# Patient Record
Sex: Female | Born: 1955 | Race: Black or African American | Hispanic: No | Marital: Married | Smoking: Former smoker
Health system: Southern US, Community
[De-identification: ages and names within clinical notes are randomized; demographics above are authoritative.]

## PROBLEM LIST (undated history)

## (undated) DIAGNOSIS — Z9889 Other specified postprocedural states: Secondary | ICD-10-CM

## (undated) DIAGNOSIS — R928 Other abnormal and inconclusive findings on diagnostic imaging of breast: Secondary | ICD-10-CM

## (undated) DIAGNOSIS — I10 Essential (primary) hypertension: Secondary | ICD-10-CM

## (undated) DIAGNOSIS — M1990 Unspecified osteoarthritis, unspecified site: Secondary | ICD-10-CM

## (undated) DIAGNOSIS — I2699 Other pulmonary embolism without acute cor pulmonale: Secondary | ICD-10-CM

## (undated) DIAGNOSIS — I890 Lymphedema, not elsewhere classified: Secondary | ICD-10-CM

## (undated) DIAGNOSIS — R112 Nausea with vomiting, unspecified: Secondary | ICD-10-CM

## (undated) DIAGNOSIS — E119 Type 2 diabetes mellitus without complications: Secondary | ICD-10-CM

## (undated) DIAGNOSIS — N816 Rectocele: Secondary | ICD-10-CM

## (undated) DIAGNOSIS — I82409 Acute embolism and thrombosis of unspecified deep veins of unspecified lower extremity: Secondary | ICD-10-CM

## (undated) DIAGNOSIS — T8859XA Other complications of anesthesia, initial encounter: Secondary | ICD-10-CM

## (undated) DIAGNOSIS — K219 Gastro-esophageal reflux disease without esophagitis: Secondary | ICD-10-CM

## (undated) DIAGNOSIS — K635 Polyp of colon: Secondary | ICD-10-CM

## (undated) HISTORY — PX: KNEE SURGERY: SHX244

## (undated) HISTORY — PX: MYOMECTOMY: SHX85

## (undated) HISTORY — DX: Essential (primary) hypertension: I10

## (undated) HISTORY — DX: Acute embolism and thrombosis of unspecified deep veins of unspecified lower extremity: I82.409

## (undated) HISTORY — PX: ABDOMINAL HYSTERECTOMY: SHX81

## (undated) HISTORY — DX: Other abnormal and inconclusive findings on diagnostic imaging of breast: R92.8

## (undated) HISTORY — DX: Other pulmonary embolism without acute cor pulmonale: I26.99

## (undated) HISTORY — PX: SHOULDER SURGERY: SHX246

## (undated) HISTORY — PX: LAPAROSCOPIC GASTRIC BANDING: SHX1100

## (undated) HISTORY — DX: Polyp of colon: K63.5

---

## 2021-03-06 ENCOUNTER — Other Ambulatory Visit: Payer: Self-pay

## 2021-03-06 ENCOUNTER — Emergency Department
Admission: EM | Admit: 2021-03-06 | Discharge: 2021-03-06 | Disposition: A | Discharge status: Home / Self Care | Payer: No Typology Code available for payment source | Source: Home / Self Care

## 2021-03-06 DIAGNOSIS — L03116 Cellulitis of left lower limb: Secondary | ICD-10-CM | POA: Diagnosis not present

## 2021-03-06 DIAGNOSIS — S80261A Insect bite (nonvenomous), right knee, initial encounter: Secondary | ICD-10-CM

## 2021-03-06 DIAGNOSIS — W57XXXA Bitten or stung by nonvenomous insect and other nonvenomous arthropods, initial encounter: Secondary | ICD-10-CM

## 2021-03-06 MED ORDER — DOXYCYCLINE HYCLATE 100 MG PO CAPS: 100.0000 mg | ORAL_CAPSULE | Freq: Two times a day (BID) | ORAL | 0 refills | Status: DC

## 2021-03-06 NOTE — Discharge Instructions (Signed)
I have sent in doxycycline for you to take one tablet twice a day for 10 days  Follow up with this office or with primary care if symptoms are persisting.  Follow up in the ER for high fever, trouble swallowing, trouble breathing, other concerning symptoms.  

## 2021-03-06 NOTE — ED Triage Notes (Signed)
Patient presents to Urgent Care with complaints of tick bite to right leg, posterior knee since 5 days ago. Patient reports the are is still itchy, would like it to be assessed, denies drainage or redness.

## 2021-03-06 NOTE — ED Provider Notes (Signed)
[facility]    CSN: [id number] Arrival date & time: 03/06/21  1504      History   Chief Complaint Chief Complaint  Patient presents with  . Insect Bite    Right Leg    HPI Natalie Carney is a 65 y.o. female.   Reports tick bite to R posterior knee x 5 days ago. States that she removed the tick. Reports that the area is red, warm, tender and itching. Has not attempted OTC treatment. There are no aggravating or alleviating factors. Denies headache, nausea, vomiting, diarrhea, rash, fatigue, drainage from the area, fever, other symptoms.  ROS per HPI  The history is provided by the patient.    History reviewed. No pertinent past medical history.  There are no problems to display for this patient.   History reviewed. No pertinent surgical history.  OB History   No obstetric history on file.      Home Medications    Prior to Admission medications   Medication Sig Start Date End Date Taking? Authorizing Provider  apixaban (ELIQUIS) 5 MG TABS tablet Take 5 mg by mouth 2 (two) times daily.   Yes [provider]  doxycycline (VIBRAMYCIN) 100 MG capsule Take 1 capsule (100 mg total) by mouth 2 (two) times daily. 03/06/21  Yes Moshe Cipro, NP  irbesartan (AVAPRO) 300 MG tablet Take 300 mg by mouth daily.   Yes [provider]  linaGLIPtin-metFORMIN HCl (JENTADUETO) 2.5-500 MG TABS Take by mouth.   Yes [provider]  Mirabegron (MYRBETRIQ PO) Take 25 mg by mouth.   Yes [provider]    Family History Family History  Problem Relation Age of Onset  . Healthy Mother   . Diabetes Father   . Hypertension Father     Social History Social History   Tobacco Use  . Smoking status: Former Smoker    Types: Cigarettes    Quit date: 12/25/1987    Years since quitting: 33.2  . Smokeless tobacco: Never Used  Substance Use Topics  . Alcohol use: Not Currently     Allergies   Patient has no known  allergies.   Review of Systems Review of Systems   Physical Exam Triage Vital Signs ED Triage Vitals  Enc Vitals Group     BP 03/06/21 1524 114/77     Pulse Rate 03/06/21 1524 94     Resp 03/06/21 1524 16     Temp 03/06/21 1524 98.2 F (36.8 C)     Temp Source 03/06/21 1524 Oral     SpO2 03/06/21 1524 97 %     Weight --      Height --      Head Circumference --      Peak Flow --      Pain Score 03/06/21 1515 0     Pain Loc --      Pain Edu? --      Excl. in GC? --    No data found.  Updated Vital Signs BP 114/77 (BP Location: Right Arm)   Pulse 94   Temp 98.2 F (36.8 C) (Oral)   Resp 16   SpO2 97%      Physical Exam Vitals and nursing note reviewed.  Constitutional:      General: She is not in acute distress.    Appearance: She is well-developed. She is obese.  HENT:     Head: Normocephalic and atraumatic.  Eyes:     Conjunctiva/sclera: Conjunctivae normal.  Cardiovascular:  Rate and Rhythm: Normal rate and regular rhythm.     Heart sounds: No murmur heard.   Pulmonary:     Effort: Pulmonary effort is normal. No respiratory distress.     Breath sounds: Normal breath sounds.  Abdominal:     Palpations: Abdomen is soft.     Tenderness: There is no abdominal tenderness.  Musculoskeletal:     Cervical back: Normal range of motion and neck supple.  Skin:    General: Skin is warm and dry.     Findings: Lesion present.       Neurological:     General: No focal deficit present.     Mental Status: She is alert and oriented to person, place, and time.  Psychiatric:        Mood and Affect: Mood normal.        Behavior: Behavior normal.        Thought Content: Thought content normal.      UC Treatments / Results  Labs (all labs ordered are listed, but only abnormal results are displayed) Labs Reviewed - No data to display  EKG   Radiology No results found.  Procedures Procedures (including critical care time)  Medications Ordered in  UC Medications - No data to display  Initial Impression / Assessment and Plan / UC Course  I have reviewed the triage vital signs and the nursing notes.  Pertinent labs & imaging results that were available during my care of the patient were reviewed by me and considered in my medical decision making (see chart for details).    Tick Bite Cellulitis  Prescribed doxycycline BID x 7 days Follow up with any fatigue, fever, rash, worsening symptoms Follow up with this office or with primary care if symptoms are persisting.  Follow up in the ER for high fever, trouble swallowing, trouble breathing, other concerning symptoms.   Final Clinical Impressions(s) / UC Diagnoses   Final diagnoses:  Insect bite of right knee, initial encounter  Cellulitis of left lower extremity     Discharge Instructions     I have sent in doxycycline for you to take one tablet twice a day for 10 days  Follow up with this office or with primary care if symptoms are persisting.  Follow up in the ER for high fever, trouble swallowing, trouble breathing, other concerning symptoms.     ED Prescriptions    Medication Sig Dispense Auth. Provider   doxycycline (VIBRAMYCIN) 100 MG capsule Take 1 capsule (100 mg total) by mouth 2 (two) times daily. 14 capsule Moshe Cipro, NP     PDMP not reviewed this encounter.   Moshe Cipro, NP 03/06/21 1641

## 2021-03-14 DIAGNOSIS — I87009 Postthrombotic syndrome without complications of unspecified extremity: Secondary | ICD-10-CM | POA: Insufficient documentation

## 2021-03-14 DIAGNOSIS — I872 Venous insufficiency (chronic) (peripheral): Secondary | ICD-10-CM | POA: Insufficient documentation

## 2021-04-11 ENCOUNTER — Encounter: Payer: Self-pay | Admitting: Medical-Surgical

## 2021-04-11 ENCOUNTER — Ambulatory Visit (INDEPENDENT_AMBULATORY_CARE_PROVIDER_SITE_OTHER): Payer: No Typology Code available for payment source | Admitting: Medical-Surgical

## 2021-04-11 ENCOUNTER — Other Ambulatory Visit: Payer: Self-pay

## 2021-04-11 VITALS — BP 113/75 | HR 63 | Temp 98.8°F | Ht 67.25 in | Wt 279.4 lb

## 2021-04-11 DIAGNOSIS — E118 Type 2 diabetes mellitus with unspecified complications: Secondary | ICD-10-CM | POA: Diagnosis not present

## 2021-04-11 DIAGNOSIS — I10 Essential (primary) hypertension: Secondary | ICD-10-CM | POA: Diagnosis not present

## 2021-04-11 DIAGNOSIS — Z7689 Persons encountering health services in other specified circumstances: Secondary | ICD-10-CM

## 2021-04-11 DIAGNOSIS — Z7901 Long term (current) use of anticoagulants: Secondary | ICD-10-CM

## 2021-04-11 DIAGNOSIS — R198 Other specified symptoms and signs involving the digestive system and abdomen: Secondary | ICD-10-CM

## 2021-04-11 DIAGNOSIS — I872 Venous insufficiency (chronic) (peripheral): Secondary | ICD-10-CM

## 2021-04-11 LAB — POCT GLYCOSYLATED HEMOGLOBIN (HGB A1C): Hemoglobin A1C: 6.4 % — AB (ref 4.0–5.6)

## 2021-04-11 MED ORDER — MIRABEGRON ER 25 MG PO TB24: 25.0000 mg | ORAL_TABLET | Freq: Every day | ORAL | 3 refills | Status: DC

## 2021-04-11 MED ORDER — IRBESARTAN 300 MG PO TABS: 300.0000 mg | ORAL_TABLET | Freq: Every day | ORAL | 3 refills | Status: DC

## 2021-04-11 NOTE — Progress Notes (Signed)
New Patient Office Visit  Subjective:  Patient ID: Natalie Carney, female    DOB: 1956/05/20  Age: 65 y.o. MRN: [id number]  CC:  Chief Complaint  Patient presents with  . Establish Care    HPI Natalie Carney presents to establish care.  She recently relocated from [place] and is a retiree from the post office.  Hypertension-taking irbesartan 300 mg daily, tolerating well without side effects.  Blood pressure looks great at 113/75.  Diabetes-has been diabetic for at least the last 5 years.  She takes linagliptin-metformin 2.5-500 mg tablets twice daily.  She does have some GI symptoms and wonders if this may be related to the medications.  Checks her sugar approximately twice weekly with her normal range between 99 and 111.  Lately she has been running a little higher with her relocation but still only at the 120-130 range.  Blood clots-history of DVT and PE.  On chronic anticoagulation with Eliquis 5 mg twice daily, tolerating well.  Notes that she does bruise and bleed easily but she is very careful on the blood thinners to avoid injury.  Venous insufficiency-Long history of venous insufficiency.  Has difficulty with her leg swelling regularly.  She does wear compression stockings bilaterally but there are days when she chooses not to wear them.  Keeps her feet elevated at night and eats a low-sodium diet.  GI symptoms-has intermittent nausea but notes that eating certain foods cause her stomach to be upset.  Has increased flatulence, belching, bloating, and loose stools when eating certain foods.  Unsure if this is a result of the metformin in her diabetes medication or if there is something going on with her GI tract.  She had an upper GI last year with no abnormal findings but did not go back for her follow-up as instructed due to relocation.  Plans to return to [place] for a visit next week and is looking to get an in office appointment with her GI provider there.  If they are unable to work  her in, she will let me know and we will do a referral for GI in our area.  Past Medical History:  Diagnosis Date  . Abnormal mammogram   . Colon polyp   . DVT (deep venous thrombosis) (HCC)   . Hypertension   . Pulmonary embolism [facility])     Past Surgical History:  Procedure Laterality Date  . ABDOMINAL HYSTERECTOMY    . KNEE SURGERY Right   . LAPAROSCOPIC GASTRIC BANDING    . SHOULDER SURGERY Left     Family History  Problem Relation Age of Onset  . Healthy Mother   . Diabetes Father   . Hypertension Father   . Heart attack Father   . Breast cancer Other   . Ovarian cancer Other     Social History   Socioeconomic History  . Marital status: Married    Spouse name: Not on file  . Number of children: Not on file  . Years of education: Not on file  . Highest education level: Not on file  Occupational History  . Not on file  Tobacco Use  . Smoking status: Former Smoker    Types: Cigarettes    Quit date: 12/25/1987    Years since quitting: 33.3  . Smokeless tobacco: Never Used  Vaping Use  . Vaping Use: Never used  Substance and Sexual Activity  . Alcohol use: Never  . Drug use: Never  . Sexual activity: Yes  Partners: Male    Birth control/protection: Surgical  Other Topics Concern  . Not on file  Social History Narrative  . Not on file   Social Determinants of Health   Financial Resource Strain: Not on file  Food Insecurity: Not on file  Transportation Needs: Not on file  Physical Activity: Not on file  Stress: Not on file  Social Connections: Not on file  Intimate Partner Violence: Not on file    ROS Review of Systems  Constitutional: Negative for chills, fatigue, fever and unexpected weight change.  Respiratory: Positive for cough (Nonproductive). Negative for chest tightness, shortness of breath and wheezing.   Cardiovascular: Positive for leg swelling. Negative for chest pain and palpitations.  Gastrointestinal: Positive for abdominal pain,  diarrhea and nausea. Negative for constipation and vomiting.  Genitourinary: Negative for dysuria, frequency and urgency.  Musculoskeletal: Positive for arthralgias, back pain, myalgias and neck pain.  Neurological: Negative for dizziness, light-headedness and headaches.  Hematological: Bruises/bleeds easily.  Psychiatric/Behavioral: Negative for dysphoric mood, self-injury, sleep disturbance and suicidal ideas. The patient is not nervous/anxious.     Objective:   Today's Vitals: BP 113/75   Pulse 63   Temp 98.8 F (37.1 C)   Ht 5' 7.25" (1.708 m)   Wt 279 lb 6.4 oz (126.7 kg)   SpO2 100%   BMI 43.44 kg/m   Physical Exam Vitals reviewed.  Constitutional:      General: She is not in acute distress.    Appearance: Normal appearance.  HENT:     Head: Normocephalic and atraumatic.  Cardiovascular:     Rate and Rhythm: Normal rate and regular rhythm.     Pulses: Normal pulses.     Heart sounds: Normal heart sounds. No murmur heard. No friction rub. No gallop.   Pulmonary:     Effort: Pulmonary effort is normal. No respiratory distress.     Breath sounds: Normal breath sounds. No wheezing.  Musculoskeletal:     Right lower leg: Edema present.     Left lower leg: Edema present.  Skin:    General: Skin is warm and dry.  Neurological:     Mental Status: She is alert and oriented to person, place, and time.  Psychiatric:        Mood and Affect: Mood normal.        Behavior: Behavior normal.        Thought Content: Thought content normal.        Judgment: Judgment normal.    Assessment & Plan:   1. Encounter to establish care Reviewed available information and discussed healthcare concerns with patient.  2. Controlled type 2 diabetes mellitus with complication, without long-term current use of insulin (HCC) Point-of-care hemoglobin A1c 6.4% today showing good control.  She is very interested in Ozempic for diabetes control and weight loss purposes.  We will send this to  the pharmacy to see if insurance will cover it and plan to decrease her linagliptin-metformin to once daily. - POCT HgB A1C  3. Essential hypertension Well-controlled on irbesartan 300 mg daily.  Refill sent to pharmacy.  4. Gastrointestinal complaint GI complaint likely related to metformin but would benefit from follow-up with GI provider.  If she is not able to get in with her provider in [place] next week, she will let me know and I will put in a referral for her.  5. Venous (peripheral) insufficiency Continue compression stockings bilaterally.  Low-sodium diet and leg elevation in the evenings.  6.  Chronic anticoagulation Continue Eliquis 5 mg twice daily as prescribed.  Refills sent to pharmacy when needed.   Outpatient Encounter Medications as of 04/11/2021  Medication Sig  . apixaban (ELIQUIS) 5 MG TABS tablet Take 5 mg by mouth 2 (two) times daily.  . Cholecalciferol 25 MCG (1000 UT) tablet Take 1 tablet by mouth daily.  Marland Kitchen esomeprazole (NEXIUM) 40 MG capsule Take 40 mg by mouth daily at 12 noon.  Marland Kitchen linaGLIPtin-metFORMIN HCl (JENTADUETO) 2.5-500 MG TABS Take 1 tablet by mouth 2 (two) times daily.  . magnesium gluconate (MAGONATE) 500 MG tablet Take 500 mg by mouth daily.  . mirabegron ER (MYRBETRIQ) 25 MG TB24 tablet Take 1 tablet (25 mg total) by mouth daily.  . multivitamin-lutein (OCUVITE-LUTEIN) CAPS capsule Take 1 capsule by mouth daily.  . [DISCONTINUED] irbesartan (AVAPRO) 300 MG tablet Take 300 mg by mouth daily.  . [DISCONTINUED] Mirabegron (MYRBETRIQ PO) Take 25 mg by mouth daily.  . irbesartan (AVAPRO) 300 MG tablet Take 1 tablet (300 mg total) by mouth daily.  . [DISCONTINUED] doxycycline (VIBRAMYCIN) 100 MG capsule Take 1 capsule (100 mg total) by mouth 2 (two) times daily.   No facility-administered encounter medications on file as of 04/11/2021.    Follow-up: Return in about 6 months (around 10/11/2021) for DM/HTN/blood clots follow up.   Thayer Ohm, DNP,  APRN, FNP-BC [facility]

## 2021-04-11 NOTE — Patient Instructions (Signed)
Critical care medicine: Principles of diagnosis and management in the adult (4th ed., pp. 1251-1270). Saunders."> Miller's anesthesia (8th ed., pp. 232-250). Saunders.">  Advance Directive  Advance directives are legal documents that allow you to make decisions about your health care and medical treatment in case you become unable to communicate for yourself. Advance directives let your wishes be known to family, friends, and health care providers. Discussing and writing advance directives should happen over time rather than all at once. Advance directives can be changed and updated at any time. There are different types of advance directives, such as:  Medical power of attorney.  Living will.  Do not resuscitate (DNR) order or do not attempt resuscitation (DNAR) order. Health care proxy and medical power of attorney A health care proxy is also called a health care agent. This person is appointed to make medical decisions for you when you are unable to make decisions for yourself. Generally, people ask a trusted friend or family member to act as their proxy and represent their preferences. Make sure you have an agreement with your trusted person to act as your proxy. A proxy may have to make a medical decision on your behalf if your wishes are not known. A medical power of attorney, also called a durable power of attorney for health care, is a legal document that names your health care proxy. Depending on the laws in your state, the document may need to be:  Signed.  Notarized.  Dated.  Copied.  Witnessed.  Incorporated into your medical record. You may also want to appoint a trusted person to manage your money in the event you are unable to do so. This is called a durable power of attorney for finances. It is a separate legal document from the durable power of attorney for health care. You may choose your health care proxy or someone different to act as your agent in money matters. If you  do not appoint a proxy, or there is a concern that the proxy is not acting in your best interest, a court may appoint a guardian to act on your behalf. Living will A living will is a set of instructions that state your wishes about medical care when you cannot express them yourself. Health care providers should keep a copy of your living will in your medical record. You may want to give a copy to family members or friends. To alert caregivers in case of an emergency, you can place a card in your wallet to let them know that you have a living will and where they can find it. A living will is used if you become:  Terminally ill.  Disabled.  Unable to communicate or make decisions. The following decisions should be included in your living will:  To use or not to use life support equipment, such as dialysis machines and breathing machines (ventilators).  Whether you want a DNR or DNAR order. This tells health care providers not to use cardiopulmonary resuscitation (CPR) if breathing or heartbeat stops.  To use or not to use tube feeding.  To be given or not to be given food and fluids.  Whether you want comfort (palliative) care when the goal becomes comfort rather than a cure.  Whether you want to donate your organs and tissues. A living will does not give instructions for distributing your money and property if you should pass away. DNR or DNAR A DNR or DNAR order is a request not to have CPR in   the event that your heart stops beating or you stop breathing. If a DNR or DNAR order has not been made and shared, a health care provider will try to help any patient whose heart has stopped or who has stopped breathing. If you plan to have surgery, talk with your health care provider about how your DNR or DNAR order will be followed if problems occur. What if I do not have an advance directive? Some states assign family decision makers to act on your behalf if you do not have an advance directive.  Each state has its own laws about advance directives. You may want to check with your health care provider, attorney, or state representative about the laws in your state. Summary  Advance directives are legal documents that allow you to make decisions about your health care and medical treatment in case you become unable to communicate for yourself.  The process of discussing and writing advance directives should happen over time. You can change and update advance directives at any time.  Advance directives may include a medical power of attorney, a living will, and a DNR or DNAR order. This information is not intended to replace advice given to you by your health care provider. Make sure you discuss any questions you have with your health care provider. Document Revised: 09/13/2020 Document Reviewed: 09/13/2020 Elsevier Patient Education  2021 Elsevier Inc.  

## 2021-04-13 ENCOUNTER — Other Ambulatory Visit: Payer: Self-pay | Admitting: Medical-Surgical

## 2021-04-13 MED ORDER — OZEMPIC (0.25 OR 0.5 MG/DOSE) 2 MG/1.5ML ~~LOC~~ SOPN
PEN_INJECTOR | SUBCUTANEOUS | 1 refills | Status: DC
Start: 2021-04-13 — End: 2021-06-13

## 2021-04-13 MED ORDER — ONDANSETRON 8 MG PO TBDP: 8.0000 mg | ORAL_TABLET | Freq: Three times a day (TID) | ORAL | 3 refills | Status: DC | PRN

## 2021-04-13 NOTE — Progress Notes (Signed)
Discussed switching up her diabetes medications in order to have some weight loss benefit. Sending in Ozempic 0.25mg  weekly to see if insurance will approve it. If they do, she should stop the linagliptin-metformin combination. The dose of Ozempic increases to 0.5mg  after 4 weeks and the up to 1mg  4 weeks later. Monitor glucose closely. The Ozempic may cause some nausea initially so sending in Zofran to use as needed to help with that.   Please contact her to make her aware and if she has any trouble with getting the medication, have her give a call.

## 2021-04-13 NOTE — Progress Notes (Signed)
Pt aware and verbalized understanding. Pt will call back if insurance will not cover Rx. No further questions or concerns at this time.

## 2021-05-08 ENCOUNTER — Telehealth: Payer: Self-pay

## 2021-05-08 NOTE — Telephone Encounter (Signed)
FYI: Pt called wanting clarification of how she is supposed to take Ozempic. I told her that the Rx is written to take 0.25 mg once a week for 28 days (4 shots), then increase to 0.5 mg once a week for 28 days (4 shots), then increase to 1 mg once a week. Pt states she did not start the injections when she first got the Rx, and that today was her 2nd injection. She said that she was confused because those instructions were not what the pharmacy put on her Rx box. She said that the pharmacy put "0.25 for 28 days, then 0.5 mg for 28 days, then 1 mg once a week." She said that she has only been doing the injection once a week, but just wanted to make sure she was taking the med correctly. I told her that she should only take 1 shot a week, that for the next 2 injections she should take 0.25, then increase to 0.5 mg once a week for the next 4 injections, then increase to 1 mg once a week from that point on. Pt verbalized understanding of instructions. No further questions or concerns at this time.

## 2021-06-13 ENCOUNTER — Other Ambulatory Visit: Payer: Self-pay

## 2021-06-13 DIAGNOSIS — E118 Type 2 diabetes mellitus with unspecified complications: Secondary | ICD-10-CM

## 2021-06-13 MED ORDER — SEMAGLUTIDE (1 MG/DOSE) 4 MG/3ML ~~LOC~~ SOPN
1.0000 mg | PEN_INJECTOR | SUBCUTANEOUS | 0 refills | Status: AC
Start: 2021-06-13 — End: 2021-07-11

## 2021-07-20 ENCOUNTER — Other Ambulatory Visit: Payer: Self-pay

## 2021-07-20 ENCOUNTER — Encounter: Payer: Self-pay | Admitting: Obstetrics and Gynecology

## 2021-07-20 ENCOUNTER — Ambulatory Visit (INDEPENDENT_AMBULATORY_CARE_PROVIDER_SITE_OTHER): Payer: No Typology Code available for payment source | Admitting: Obstetrics and Gynecology

## 2021-07-20 ENCOUNTER — Encounter: Payer: Self-pay | Admitting: *Deleted

## 2021-07-20 VITALS — BP 168/66 | HR 65 | Resp 16 | Ht 67.25 in

## 2021-07-20 DIAGNOSIS — N898 Other specified noninflammatory disorders of vagina: Secondary | ICD-10-CM

## 2021-07-20 DIAGNOSIS — S39011S Strain of muscle, fascia and tendon of abdomen, sequela: Secondary | ICD-10-CM

## 2021-07-20 DIAGNOSIS — Z1231 Encounter for screening mammogram for malignant neoplasm of breast: Secondary | ICD-10-CM | POA: Diagnosis not present

## 2021-07-20 DIAGNOSIS — R3915 Urgency of urination: Secondary | ICD-10-CM

## 2021-07-20 DIAGNOSIS — Z01419 Encounter for gynecological examination (general) (routine) without abnormal findings: Secondary | ICD-10-CM | POA: Diagnosis not present

## 2021-07-20 DIAGNOSIS — N819 Female genital prolapse, unspecified: Secondary | ICD-10-CM | POA: Diagnosis not present

## 2021-07-20 NOTE — Progress Notes (Signed)
Last pap 04/25/20- negative  Last mam- 04/21/20 Breast U/S- 07/28/20- normal

## 2021-07-20 NOTE — Progress Notes (Signed)
GYNECOLOGY ANNUAL PREVENTATIVE CARE ENCOUNTER NOTE  Subjective:   Aundra Pung is a 65 y.o. G1P1 female here for a annual gynecologic exam. Current complaints: none, here for annual. Does report some prolapse and having to push organs back inside her vagina occasionally, not sexually active as she has significant pain in abdominal wall.     Denies abnormal vaginal bleeding, discharge, pelvic pain, problems with intercourse or other gynecologic concerns. Declines STI screen.   Gynecologic History No LMP recorded. Patient has had a hysterectomy. Contraception: status post hysterectomy Last Pap: 03/2020. Results: normal Last mammogram: 2021. Results: normal DEXA: UTD  Obstetric History OB History  Gravida Para Term Preterm AB Living  1 1          SAB IAB Ectopic Multiple Live Births          1    # Outcome Date GA Lbr Len/2nd Weight Sex Delivery Anes PTL Lv  1 Para      Vag-Spont       Past Medical History:  Diagnosis Date   Abnormal mammogram    Colon polyp    DVT (deep venous thrombosis) (HCC)    Hypertension    Pulmonary embolism (HCC)     Past Surgical History:  Procedure Laterality Date   ABDOMINAL HYSTERECTOMY     KNEE SURGERY Right    LAPAROSCOPIC GASTRIC BANDING     SHOULDER SURGERY Left     Current Outpatient Medications on File Prior to Visit  Medication Sig Dispense Refill   apixaban (ELIQUIS) 5 MG TABS tablet Take 5 mg by mouth 2 (two) times daily.     Cholecalciferol (D3 2000) 50 MCG (2000 UT) CAPS Take by mouth.     Cholecalciferol 25 MCG (1000 UT) tablet Take 1 tablet by mouth daily.     esomeprazole (NEXIUM) 40 MG capsule Take 40 mg by mouth daily at 12 noon.     magnesium gluconate (MAGONATE) 500 MG tablet Take 500 mg by mouth daily.     mirabegron ER (MYRBETRIQ) 25 MG TB24 tablet Take 1 tablet (25 mg total) by mouth daily. 90 tablet 3   multivitamin-lutein (OCUVITE-LUTEIN) CAPS capsule Take 1 capsule by mouth daily.     valsartan (DIOVAN) 320 MG  tablet Take 320 mg by mouth daily.     ondansetron (ZOFRAN-ODT) 8 MG disintegrating tablet Take 1 tablet (8 mg total) by mouth every 8 (eight) hours as needed for nausea. (Patient not taking: Reported on 07/20/2021) 20 tablet 3   No current facility-administered medications on file prior to visit.    No Known Allergies  Social History   Socioeconomic History   Marital status: Married    Spouse name: Not on file   Number of children: Not on file   Years of education: Not on file   Highest education level: Not on file  Occupational History   Not on file  Tobacco Use   Smoking status: Former    Types: Cigarettes    Quit date: 12/25/1987    Years since quitting: 33.5   Smokeless tobacco: Never  Vaping Use   Vaping Use: Never used  Substance and Sexual Activity   Alcohol use: Never   Drug use: Never   Sexual activity: Yes    Partners: Male    Birth control/protection: Surgical  Other Topics Concern   Not on file  Social History Narrative   Not on file   Social Determinants of Health   Financial Resource Strain: Not on  file  Food Insecurity: Not on file  Transportation Needs: Not on file  Physical Activity: Not on file  Stress: Not on file  Social Connections: Not on file  Intimate Partner Violence: Not on file    Family History  Problem Relation Age of Onset   Diabetes Father    Hypertension Father    Heart attack Father    Breast cancer Other    Ovarian cancer Other    Breast cancer Maternal Aunt    The following portions of the patient's history were reviewed and updated as appropriate: allergies, current medications, past family history, past medical history, past social history, past surgical history and problem list.  Review of Systems Pertinent items are noted in HPI.   Objective:  BP (!) 168/66   Pulse 65   Resp 16   Ht 5' 7.25" (1.708 m)   BMI 43.44 kg/m  CONSTITUTIONAL: Well-developed, well-nourished female in no acute distress.  HENT:   Normocephalic, atraumatic, External right and left ear normal. Oropharynx is clear and moist EYES: Conjunctivae and EOM are normal. Pupils are equal, round, and reactive to light. No scleral icterus.  NECK: Normal range of motion, supple, no masses.  Normal thyroid.  SKIN: Skin is warm and dry. No rash noted. Not diaphoretic. No erythema. No pallor. NEUROLOGIC: Alert and oriented to person, place, and time. Normal reflexes, muscle tone coordination. No cranial nerve deficit noted. PSYCHIATRIC: Normal mood and affect. Normal behavior. Normal judgment and thought content. CARDIOVASCULAR: Normal heart rate noted RESPIRATORY: Effort normal, no problems with respiration noted. BREASTS: Symmetric in size. No masses, skin changes, nipple drainage, or lymphadenopathy. ABDOMEN: Soft, no distention noted.  No tenderness, rebound or guarding. Well healed pfannenstiel incision PELVIC: Normal appearing external genitalia; normal appearing vaginal mucosa and cervix.  1 cm right sided vaginal wall lesion near introitus protruding from wall, appearance of cyst, No abnormal discharge noted.  Uterus surgically absent, no other palpable masses, no adnexal tenderness. MUSCULOSKELETAL: Normal range of motion. No tenderness.  No cyanosis, clubbing, or edema.  2+ distal pulses.  Exam done with chaperone present.  Assessment and Plan:   1. Well woman exam Healthy female exam  2. Encounter for screening mammogram for malignant neoplasm of breast Mammogram today  3. Vaginal lesion - Benign appearing as appearance of cyst just under serosa but offered biopsy, patient declines and elects to watch and wait, will reexamine 6 months - she verbalizes understanding of possibility of missing malignancy if no biopsy done now, prefers to see if grows or recedes  4. Strain of rectus abdominis muscle, sequela - Pt has had pain in abdominal wall for 20 years, since hysterectomy - recommend PT for assessment, core  strengthening exercises  5. Female genital prolapse, unspecified type - Patient has to push pelvic organs back inside sometimes, has never been addressed, briefly reviewed options as this bothersome to her - recommend referral to Urogyn  6. Urinary urgency - Reviewed strategies for improvement, timed voiding, limiting intake, decreasing bladder irritants - cont myrbtriq which she states helps   COVID vaccine TD Declines STI screen. Mammogram ordered today Referral for colonoscopy n/a Flu vaccine n/a DEXA not due   Routine preventative health maintenance measures emphasized. Please refer to After Visit Summary for other counseling recommendations.    Baldemar Lenis, MD, [facility] Attending Center for [facility])

## 2021-07-25 ENCOUNTER — Ambulatory Visit: Payer: No Typology Code available for payment source | Admitting: Physical Therapy

## 2021-07-25 ENCOUNTER — Encounter: Payer: Self-pay | Admitting: Physical Therapy

## 2021-07-25 DIAGNOSIS — G8929 Other chronic pain: Secondary | ICD-10-CM | POA: Diagnosis not present

## 2021-07-25 DIAGNOSIS — M6281 Muscle weakness (generalized): Secondary | ICD-10-CM

## 2021-07-25 DIAGNOSIS — R252 Cramp and spasm: Secondary | ICD-10-CM

## 2021-07-25 DIAGNOSIS — M545 Low back pain, unspecified: Secondary | ICD-10-CM

## 2021-07-25 NOTE — Therapy (Signed)
[facility] [place] [address] [place], [place], [address] Phone: [phone]   Fax:  [phone]  Physical Therapy Evaluation  Patient Details  Name: Natalie Carney MRN: [id number] Date of Birth: 04-28-1956 Referring Provider (PT): Leroy Libman MD   Encounter Date: 07/25/2021   PT End of Session - 07/25/21 0933     Visit Number 1    Number of Visits 12    Date for PT Re-Evaluation 09/05/21    PT Start Time 0933    PT Stop Time 1026    PT Time Calculation (min) 53 min    Activity Tolerance Patient tolerated treatment well    Behavior During Therapy [facility] for tasks assessed/performed             Past Medical History:  Diagnosis Date   Abnormal mammogram    Colon polyp    DVT (deep venous thrombosis) (HCC)    Hypertension    Pulmonary embolism (HCC)     Past Surgical History:  Procedure Laterality Date   ABDOMINAL HYSTERECTOMY     KNEE SURGERY Right    LAPAROSCOPIC GASTRIC BANDING     SHOULDER SURGERY Left     There were no vitals filed for this visit.    Subjective Assessment - 07/25/21 0937     Subjective If I bend down too far or bend over with crossed legs I feel a squeezing in the right lower abdomen. This started after her hysterctomy in 2005. She does some stretching and eventually it works itself out. Refraining from sex due to the activation of the muscle immediately coming on. Sometimes she gets it on the left side too. Patient reports she does have bil lower back pain. It does not bother her often.    Pertinent History DVTs bil (on blood thinners), hysterectomy 2005, had a lap band and then had it removed 5-6 yrs ago (after 3 yrs), two left shoulder surgeries and right knee surgery    Patient Stated Goals get rid of pain    Currently in Pain? Yes    Pain Score 10-Worst pain ever    Pain Location Abdomen    Pain Orientation Right    Pain Descriptors / Indicators Squeezing    Pain Type Chronic pain     Pain Onset More than a month ago    Pain Frequency Intermittent    Aggravating Factors  bending, sex    Pain Relieving Factors stretching    Effect of Pain on Daily Activities tying her shoes, using dryer                [facility] PT Assessment - 07/25/21 0001       Assessment   Medical Diagnosis rectus abdominus strain    Referring Provider (PT) Leroy Libman MD    Onset Date/Surgical Date 12/24/10    Hand Dominance Left    Next MD Visit 3 months      Precautions   Precautions None      Restrictions   Weight Bearing Restrictions No      Balance Screen   Has the patient fallen in the past 6 months No    Has the patient had a decrease in activity level because of a fear of falling?  No    Is the patient reluctant to leave their home because of a fear of falling?  No      Home Tourist information centre manager residence      Prior Function  Level of Independence Independent    Vocation Unemployed      Posture/Postural Control   Posture Comments forward head, increased lumbar lordosis      ROM / Strength   AROM / PROM / Strength AROM      AROM   Overall AROM Comments lumbar flex 75%, bil SB limited 75%, right rotation limited 25%, ext WNL feels pull in all planes; bil hips [facility]      Flexibility   Soft Tissue Assessment /Muscle Length yes    Hamstrings WNL    Quadriceps bil tightness    Piriformis WNL    Quadratus Lumborum tight; left > right      Palpation   Spinal mobility WNL but painful on left with muscular tightness    Palpation comment marked left iliopsoas, mod right; marked gluteals bil      Special Tests   Other special tests neg Thomas test bil                        Objective measurements completed on examination: See above findings.       [facility] Adult PT Treatment/Exercise - 07/25/21 0001       Self-Care   Self-Care Other Self-Care Comments    Other Self-Care Comments  MFR with ball to psoas and gluteals; diaphragmatic  breathing in hooklying x 5 breaths      Exercises   Exercises Lumbar      Lumbar Exercises: Stretches   Other Lumbar Stretch Exercise open book with top leg extended x 5 bil      Manual Therapy   Manual Therapy Myofascial release;Joint mobilization    Joint Mobilization UPA mobs bil lumbar gd I/II    Myofascial Release right hip flexor release in hooklying with active hip flexion x 10; attempted left but too painful today                    PT Education - 07/25/21 1100     Education Details HEP; self MFR of psoas in standing with ball    Person(s) Educated Patient    Methods Explanation;Demonstration;Handout    Comprehension Verbalized understanding;Returned demonstration                 PT Long Term Goals - 07/25/21 1048       PT LONG TERM GOAL #1   Title Ind with advanced HEP    Time 6    Period Weeks    Status New      PT LONG TERM GOAL #2   Title Patient to report decreased frequency and intensity of abdominal cramping by >= 50% with ADLS.    Time 6    Period Weeks    Status New      PT LONG TERM GOAL #3   Title Patient able to tie her shoes without abdominal cramping.    Time 6    Period Weeks    Status New      PT LONG TERM GOAL #4   Title Patient to demo improved lumbar flexion and lateral flexion to [facility] to decrease strain on her back and hips.    Time 6    Period Weeks    Status New      PT LONG TERM GOAL #5   Title Patient to demo 5/5 end range hip flexion to decrease strain on hip flexors. (eg from 90 deg and above)    Time 6  Period Weeks    Status New                    Plan - 07/25/21 1029     Clinical Impression Statement Patient presents with c/o of intemittent right abdominal cramping x 10 years. The cramping began a few years after her hysterectomy and is aggravated by tying her shoes and bending forward. Patient is unable to have sex due to pain as well. She reports intermittent low back pain as well. She is  significantly limited with lumbar flexion and lateral bending, however her PA mobility is [facility]. Left PA mobs grade II are very painful. She has marked tenderness on the Left side in her iliopsoas, gluteals and lumbar, more so than on the right. She was able to tolerate a psoas release on the right but not the left today. She has generally good flexibility in bil LE except for her bil quads. She had negative Thomas test bil. She has weakness in end range hip flexion and poor lumbar stabilization with MMT of hip flexors. She will benefit from PT to address these deficits.    Personal Factors and Comorbidities Comorbidity 3+    Comorbidities DVTs bil (on blood thinners), hysterectomy 2005, had a lap band and then had it removed 5-6 yrs ago (after 3 yrs), two left shoulder surgeries and right knee surgery    Examination-Activity Limitations Bend;Dressing;Other   sexual intercourse   Stability/Clinical Decision Making Evolving/Moderate complexity    Clinical Decision Making Low    Rehab Potential Excellent    PT Frequency 2x / week    PT Duration 6 weeks    PT Treatment/Interventions ADLs/Self Care Home Management;Aquatic Therapy;Cryotherapy;Electrical Stimulation;Moist Heat;Therapeutic exercise;Therapeutic activities;Patient/family education;Manual techniques;Spinal Manipulations;Joint Manipulations;Taping    PT Next Visit Plan Review HEP, add lateral flexion stretch, give psoas release video, spinal mobs, isometric to eccentric end range hip flexor strengthening as tolerated    PT Home Exercise Plan [email or web address]    Consulted and Agree with Plan of Care Patient             Patient will benefit from skilled therapeutic intervention in order to improve the following deficits and impairments:  Decreased range of motion, Increased muscle spasms, Pain, Impaired flexibility, Decreased strength  Visit Diagnosis: Cramp and spasm - Plan: PT plan of care cert/re-cert  Chronic bilateral low back pain without  sciatica - Plan: PT plan of care cert/re-cert  Muscle weakness (generalized) - Plan: PT plan of care cert/re-cert     Problem List Patient Active Problem List   Diagnosis Date Noted   Chronic anticoagulation 04/11/2021   Essential hypertension 04/11/2021   Controlled type 2 diabetes mellitus with complication, without long-term current use of insulin (HCC) 04/11/2021   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

## 2021-07-25 NOTE — Patient Instructions (Signed)
Access Code: [email or web address] URL: [email or web address] Date: 07/25/2021 Prepared by: Raynelle Fanning  Exercises Sidelying Open Book Thoracic Lumbar Rotation and Extension - 1 x daily - 7 x weekly - 3 sets - 10 reps Supine Diaphragmatic Breathing - 1-2 x daily - 7 x weekly - 2 sets - 10 reps Standing Quadratus Lumborum Mobilization with Small Ball on Wall - 1 x daily - 7 x weekly - 3 sets - 10 reps

## 2021-07-27 ENCOUNTER — Ambulatory Visit (INDEPENDENT_AMBULATORY_CARE_PROVIDER_SITE_OTHER): Payer: No Typology Code available for payment source | Admitting: Physical Therapy

## 2021-07-27 ENCOUNTER — Encounter: Payer: Self-pay | Admitting: Physical Therapy

## 2021-07-27 ENCOUNTER — Ambulatory Visit (INDEPENDENT_AMBULATORY_CARE_PROVIDER_SITE_OTHER): Payer: No Typology Code available for payment source

## 2021-07-27 ENCOUNTER — Other Ambulatory Visit: Payer: Self-pay

## 2021-07-27 DIAGNOSIS — M545 Low back pain, unspecified: Secondary | ICD-10-CM

## 2021-07-27 DIAGNOSIS — G8929 Other chronic pain: Secondary | ICD-10-CM

## 2021-07-27 DIAGNOSIS — R252 Cramp and spasm: Secondary | ICD-10-CM

## 2021-07-27 DIAGNOSIS — Z1231 Encounter for screening mammogram for malignant neoplasm of breast: Secondary | ICD-10-CM

## 2021-07-27 DIAGNOSIS — M6281 Muscle weakness (generalized): Secondary | ICD-10-CM | POA: Diagnosis not present

## 2021-07-27 NOTE — Patient Instructions (Signed)
Access Code: [email or web address] URL: [email or web address] Date: 07/27/2021 Prepared by: Raynelle Fanning  Exercises Sidelying Open Book Thoracic Lumbar Rotation and Extension - 1 x daily - 7 x weekly - 3 sets - 10 reps Supine Diaphragmatic Breathing - 1-2 x daily - 7 x weekly - 2 sets - 10 reps Standing Quadratus Lumborum Mobilization with Small Ball on Wall - 1 x daily - 7 x weekly - 3 sets - 10 reps Supine Bridge - 2 x daily - 7 x weekly - 1-3 sets - 10 reps Supine March - 1 x daily - 7 x weekly - 2 sets - 10 reps

## 2021-07-27 NOTE — Therapy (Signed)
[facility] [place] [address] [place], [place], [address] Phone: [phone]   Fax:  [phone]  Physical Therapy Treatment  Patient Details  Name: Natalie Carney MRN: [id number] Date of Birth: 04/15/1956 Referring Provider (PT): Leroy Libman MD   Encounter Date: 07/27/2021   PT End of Session - 07/27/21 1012     Visit Number 2    Number of Visits 12    Date for PT Re-Evaluation 09/05/21    PT Start Time 1012    PT Stop Time 1057    PT Time Calculation (min) 45 min    Activity Tolerance Patient tolerated treatment well    Behavior During Therapy [facility] for tasks assessed/performed             Past Medical History:  Diagnosis Date   Abnormal mammogram    Colon polyp    DVT (deep venous thrombosis) (HCC)    Hypertension    Pulmonary embolism (HCC)     Past Surgical History:  Procedure Laterality Date   ABDOMINAL HYSTERECTOMY     KNEE SURGERY Right    LAPAROSCOPIC GASTRIC BANDING     SHOULDER SURGERY Left     There were no vitals filed for this visit.   Subjective Assessment - 07/27/21 1012     Subjective Feeling pain more on left side today and into ant hip (TFL region indicated).    Pertinent History DVTs bil (on blood thinners), hysterectomy 2005, had a lap band and then had it removed 5-6 yrs ago (after 3 yrs), two left shoulder surgeries and right knee surgery    Patient Stated Goals get rid of pain    Currently in Pain? Yes    Pain Score 5     Pain Location Hip    Pain Orientation Left;Anterior    Pain Descriptors / Indicators Burning    Pain Type Chronic pain                               [facility] Adult PT Treatment/Exercise - 07/27/21 0001       Lumbar Exercises: Stretches   Hip Flexor Stretch Right;Left;5 reps   more for calming of CNS vs stretch   Hip Flexor Stretch Limitations 2 sets; standing on black step, one leg off and swinging leg with abdominals stabilizing       Lumbar Exercises: Supine   Ab Set 5 reps;5 seconds    AB Set Limitations using pelvic floor muscles originally then isolating to TA    Pelvic Tilt 1 rep    Pelvic Tilt Limitations no difficulty    Bent Knee Raise 5 reps    Bent Knee Raise Limitations ea    Bridge 20 reps    Bridge Limitations pt reports back feeling looser      Lumbar Exercises: Prone   Straight Leg Raise 5 reps    Straight Leg Raises Limitations each    Other Prone Lumbar Exercises pelvic press series 5x 5 sec, unilateral and bil knee flex x 5 ea      Lumbar Exercises: Quadruped   Straight Leg Raise 5 reps    Straight Leg Raises Limitations ea; hard on knees but good form      Manual Therapy   Manual Therapy Myofascial release;Joint mobilization    Manual therapy comments FABER/FABIR assessed bil; all negative    Joint Mobilization gentle UPA left side gd I/II still quite paiful at  L4/5, L5/S1; long leg tractin to left LE 2 x 20 sec (no relief of ant hip sx)    Myofascial Release left QL release in SDLY; J stroke to Left gluteals                    PT Education - 07/27/21 1218     Education Details HEP progressed    Person(s) Educated Patient    Methods Explanation;Demonstration;Handout    Comprehension Verbalized understanding;Returned demonstration                 PT Long Term Goals - 07/25/21 1048       PT LONG TERM GOAL #1   Title Ind with advanced HEP    Time 6    Period Weeks    Status New      PT LONG TERM GOAL #2   Title Patient to report decreased frequency and intensity of abdominal cramping by >= 50% with ADLS.    Time 6    Period Weeks    Status New      PT LONG TERM GOAL #3   Title Patient able to tie her shoes without abdominal cramping.    Time 6    Period Weeks    Status New      PT LONG TERM GOAL #4   Title Patient to demo improved lumbar flexion and lateral flexion to [facility] to decrease strain on her back and hips.    Time 6    Period Weeks    Status New       PT LONG TERM GOAL #5   Title Patient to demo 5/5 end range hip flexion to decrease strain on hip flexors. (eg from 90 deg and above)    Time 6    Period Weeks    Status New                   Plan - 07/27/21 1225     Clinical Impression Statement Patient presents today reporting pain more in the left side low back and into ant hip today. She has marked tightness of her left QL and gluteals. Had planned to due a trial of estim today to address this but she had to leave for an appt. She has a TENs unit at home which she will try before next visit. I feel the abdominal/psoas spasm is coming from the left low back. She had some relief with bridging today and we worked on transverse abdominus and lumbar stab which she did very well with. She will benefit from continued manual therapy to decrease muscle tone in lumbar, gluteals and psoas.    Comorbidities DVTs bil (on blood thinners), hysterectomy 2005, had a lap band and then had it removed 5-6 yrs ago (after 3 yrs), two left shoulder surgeries and right knee surgery    PT Treatment/Interventions ADLs/Self Care Home Management;Aquatic Therapy;Cryotherapy;Electrical Stimulation;Moist Heat;Therapeutic exercise;Therapeutic activities;Patient/family education;Manual techniques;Spinal Manipulations;Joint Manipulations;Taping    PT Next Visit Plan continue with manual therapy to release QL, gluteals and psoas bil;  add lateral flexion stretch, progress to extension biased exercises as tolerated;  isometric to eccentric end range hip flexor strengthening as tolerated; I didn't put traction on POC but at some point this may be worth a try (would have to recert for it - sorry).    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

## 2021-07-31 ENCOUNTER — Ambulatory Visit (INDEPENDENT_AMBULATORY_CARE_PROVIDER_SITE_OTHER): Payer: No Typology Code available for payment source | Admitting: Physical Therapy

## 2021-07-31 ENCOUNTER — Encounter: Payer: Self-pay | Admitting: Physical Therapy

## 2021-07-31 ENCOUNTER — Other Ambulatory Visit: Payer: Self-pay

## 2021-07-31 DIAGNOSIS — M545 Low back pain, unspecified: Secondary | ICD-10-CM | POA: Diagnosis not present

## 2021-07-31 DIAGNOSIS — M6281 Muscle weakness (generalized): Secondary | ICD-10-CM

## 2021-07-31 DIAGNOSIS — G8929 Other chronic pain: Secondary | ICD-10-CM

## 2021-07-31 DIAGNOSIS — R252 Cramp and spasm: Secondary | ICD-10-CM | POA: Diagnosis not present

## 2021-07-31 NOTE — Therapy (Signed)
[place], [place], [address] Phone: [phone]   Fax:  [phone]  Physical Therapy Treatment  Patient Details  Name: [name] MRN: [id number] Date of Birth: 02/21/1956 Referring Provider (PT): Vivien Rota MD   Encounter Date: 07/31/2021   PT End of Session - 07/31/21 0959     Visit Number 3    Number of Visits 12    Date for PT Re-Evaluation 09/05/21    PT Start Time 0940   pt arrived late   PT Stop Time 1014    PT Time Calculation (min) 34 min    Activity Tolerance Patient limited by pain    Behavior During Therapy [facility] for tasks assessed/performed             Past Medical History:  Diagnosis Date   Abnormal mammogram    Colon polyp    DVT (deep venous thrombosis) ([place])    Hypertension    Pulmonary embolism ([place])     Past Surgical History:  Procedure Laterality Date   ABDOMINAL HYSTERECTOMY     KNEE SURGERY Right    LAPAROSCOPIC GASTRIC BANDING     SHOULDER SURGERY Left     There were no vitals filed for this visit.   Subjective Assessment - 07/31/21 0942     Subjective Pt reports she woke up with a sore neck (Lt side). She has a menthol patch on the area.  She states that the leg swings with her LLE made her Lt hip later in the day.    Currently in Pain? Yes    Pain Score 10-Worst pain ever    Pain Location Neck    Pain Orientation Left    Pain Descriptors / Indicators Sharp    Aggravating Factors  moving    Pain Relieving Factors menthol patch.    Multiple Pain Sites Yes    Pain Score 3    Pain Location Hip    Pain Orientation Left    Pain Descriptors / Indicators Sore    Aggravating Factors  bending over    Pain Relieving Factors stretches                [facility] PT Assessment - 07/31/21 0001       Assessment   Medical Diagnosis rectus abdominus strain    Referring Provider (PT) Vivien Rota MD    Onset Date/Surgical Date 12/24/10    Hand Dominance  Left    Next MD Visit 3 months              [facility] Adult PT Treatment/Exercise - 07/31/21 0001       Lumbar Exercises: Stretches   Piriformis Stretch Left;2 reps;Right;1 rep;20 seconds    Figure 4 Stretch 2 reps;20 seconds   2 reps L, 1 rep R   Other Lumbar Stretch Exercise Open book x 8 reps each side.      Lumbar Exercises: Aerobic   Nustep L4: 4 min for warm up.      Lumbar Exercises: Supine   Bridge 10 reps;4 seconds    Bridge Limitations cues to engage core and buttocks      Lumbar Exercises: Sidelying   Clam Left;10 reps;3 seconds      Manual Therapy   Manual Therapy Soft tissue mobilization;Myofascial release    Manual therapy comments passive QL stretch with Lt arm overhead in Rt sidelying.    Soft tissue mobilization STM to Lt glutes/ hip rotators.  Myofascial Release Lt iliacus in Rt sidelying.                         PT Long Term Goals - 07/25/21 1048       PT LONG TERM GOAL #1   Title Ind with advanced HEP    Time 6    Period Weeks    Status New      PT LONG TERM GOAL #2   Title Patient to report decreased frequency and intensity of abdominal cramping by >= 50% with ADLS.    Time 6    Period Weeks    Status New      PT LONG TERM GOAL #3   Title Patient able to tie her shoes without abdominal cramping.    Time 6    Period Weeks    Status New      PT LONG TERM GOAL #4   Title Patient to demo improved lumbar flexion and lateral flexion to [facility] to decrease strain on her back and hips.    Time 6    Period Weeks    Status New      PT LONG TERM GOAL #5   Title Patient to demo 5/5 end range hip flexion to decrease strain on hip flexors. (eg from 90 deg and above)    Time 6    Period Weeks    Status New                   Plan - 07/31/21 6237     Clinical Impression Statement Pt arrived with elevated pain level in Lt neck. This limited her overall exercise tolerance and speed of moving between exercises. Despite the pain  reported, pt participated well throughout session. No new goals met this session.    Comorbidities DVTs bil (on blood thinners), hysterectomy 2005, had a lap band and then had it removed 5-6 yrs ago (after 3 yrs), two left shoulder surgeries and right knee surgery    PT Treatment/Interventions ADLs/Self Care Home Management;Aquatic Therapy;Cryotherapy;Electrical Stimulation;Moist Heat;Therapeutic exercise;Therapeutic activities;Patient/family education;Manual techniques;Spinal Manipulations;Joint Manipulations;Taping    PT Next Visit Plan continue with manual therapy to release QL, gluteals and psoas bil;  add lateral flexion stretch, progress to extension biased exercises as tolerated;  isometric to eccentric end range hip flexor strengthening as tolerated; I didn't put traction on POC but at some point this may be worth a try (would have to recert for it - sorry).    PT Home Exercise Plan [id number]             Patient will benefit from skilled therapeutic intervention in order to improve the following deficits and impairments:     Visit Diagnosis: Cramp and spasm  Chronic bilateral low back pain without sciatica  Muscle weakness (generalized)     Problem List Patient Active Problem List   Diagnosis Date Noted   Chronic anticoagulation 04/11/2021   Essential hypertension 04/11/2021   Controlled type 2 diabetes mellitus with complication, without long-term current use of insulin ([place]) 04/11/2021   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   Venous (peripheral) insufficiency 03/14/2021   Natalie Carney, PTA 07/31/21 10:23 AM  [place], [place], [address] Phone: [phone]   Fax:  [phone]  Name: Natalie Carney MRN: [id number] Date of Birth: 1956/05/01

## 2021-08-03 ENCOUNTER — Encounter: Payer: Self-pay | Admitting: Physical Therapy

## 2021-08-03 ENCOUNTER — Ambulatory Visit (INDEPENDENT_AMBULATORY_CARE_PROVIDER_SITE_OTHER): Payer: No Typology Code available for payment source | Admitting: Physical Therapy

## 2021-08-03 ENCOUNTER — Other Ambulatory Visit: Payer: Self-pay

## 2021-08-03 DIAGNOSIS — M6281 Muscle weakness (generalized): Secondary | ICD-10-CM

## 2021-08-03 DIAGNOSIS — R252 Cramp and spasm: Secondary | ICD-10-CM

## 2021-08-03 DIAGNOSIS — G8929 Other chronic pain: Secondary | ICD-10-CM | POA: Diagnosis not present

## 2021-08-03 DIAGNOSIS — M545 Low back pain, unspecified: Secondary | ICD-10-CM | POA: Diagnosis not present

## 2021-08-03 NOTE — Therapy (Signed)
[facility] [place] [address] [place], [place], [address] Phone: [phone]   Fax:  [phone]  Physical Therapy Treatment  Patient Details  Name: Natalie Carney MRN: [id number] Date of Birth: [birth year] Referring Provider (PT): Leroy Libman MD   Encounter Date: 08/03/2021   PT End of Session - 08/03/21 0941     Visit Number 4    Number of Visits 12    Date for PT Re-Evaluation 09/05/21    PT Start Time 4081   pt arrived late   PT Stop Time 1015    PT Time Calculation (min) 38 min    Activity Tolerance Patient tolerated treatment well    Behavior During Therapy [facility] for tasks assessed/performed             Past Medical History:  Diagnosis Date   Abnormal mammogram    Colon polyp    DVT (deep venous thrombosis) ([facility])    Hypertension    Pulmonary embolism ([facility])     Past Surgical History:  Procedure Laterality Date   ABDOMINAL HYSTERECTOMY     KNEE SURGERY Right    LAPAROSCOPIC GASTRIC BANDING     SHOULDER SURGERY Left     There were no vitals filed for this visit.   Subjective Assessment - 08/03/21 0941     Subjective Pt reports that her neck is gradually feeling better.  Used TENS and tigerbalm with some relief.  She has been doing some of her HEP exercises with relief.    Currently in Pain? Yes    Pain Score 8     Pain Location Neck    Pain Orientation Left    Pain Descriptors / Indicators Sharp    Aggravating Factors  moving certain ways    Pain Relieving Factors TENs, tigerbalm    Pain Score 6    Pain Location Back    Pain Orientation Left    Pain Descriptors / Indicators Sore    Aggravating Factors  bending over    Pain Relieving Factors stretches.                [facility] PT Assessment - 08/03/21 0001       Assessment   Medical Diagnosis rectus abdominus strain    Referring Provider (PT) Leroy Libman MD    Onset Date/Surgical Date 12/24/10    Hand Dominance Left    Next MD Visit 3  months              [facility] Adult PT Treatment/Exercise - 08/03/21 0001       Lumbar Exercises: Stretches   Hip Flexor Stretch Left;2 reps;Right;1 rep;20 seconds   seated with leg back, arm overhead for 2nd rep   Hip Flexor Stretch Limitations --    Piriformis Stretch Left;Right;2 reps;20 seconds   modified pigeon pose   Piriformis Stretch Limitations seated version pulling knee towards chest x 20 sec each (preferred- added to HEP)    Other Lumbar Stretch Exercise seated modified childs pose with arms on table rolling forward, then with Rt lateral trunk flexion for QL stretch.   Supine LTR with feet wide for hip stretch x 5.      Lumbar Exercises: Aerobic   Nustep L5: 5 min for warm up, legs/arms.      Lumbar Exercises: Seated   Sit to Stand 10 reps    Sit to Stand Limitations 3 from elevated surface, 7 from low black mat, with cues for eccentric lowering and core engaged.  PT Long Term Goals - 07/25/21 1048       PT LONG TERM GOAL #1   Title Ind with advanced HEP    Time 6    Period Weeks    Status New      PT LONG TERM GOAL #2   Title Patient to report decreased frequency and intensity of abdominal cramping by >= 50% with ADLS.    Time 6    Period Weeks    Status New      PT LONG TERM GOAL #3   Title Patient able to tie her shoes without abdominal cramping.    Time 6    Period Weeks    Status New      PT LONG TERM GOAL #4   Title Patient to demo improved lumbar flexion and lateral flexion to [facility] to decrease strain on her back and hips.    Time 6    Period Weeks    Status New      PT LONG TERM GOAL #5   Title Patient to demo 5/5 end range hip flexion to decrease strain on hip flexors. (eg from 90 deg and above)    Time 6    Period Weeks    Status New                   Plan - 08/03/21 1016     Clinical Impression Statement Pt reported reduction of Lt hip/low back pain by 2 points by end of session. She reported greater ease with  gait after hip stretches.  Bilat hips remain tight (Lt hip rotators, Rt quad).  Encouraged continued use of TENS at home for pain control.  Progressing gradually towards goals.    Comorbidities DVTs bil (on blood thinners), hysterectomy 2005, had a lap band and then had it removed 5-6 yrs ago (after 3 yrs), two left shoulder surgeries and right knee surgery    Rehab Potential Excellent    PT Frequency 2x / week    PT Duration 6 weeks    PT Treatment/Interventions ADLs/Self Care Home Management;Aquatic Therapy;Cryotherapy;Electrical Stimulation;Moist Heat;Therapeutic exercise;Therapeutic activities;Patient/family education;Manual techniques;Spinal Manipulations;Joint Manipulations;Taping    PT Next Visit Plan continue with manual therapy to release QL, gluteals and psoas bil;  add lateral flexion stretch, progress to extension biased exercises as tolerated;  isometric to eccentric end range hip flexor strengthening as tolerated.   PT Home Exercise Plan [email or web address]             Patient will benefit from skilled therapeutic intervention in order to improve the following deficits and impairments:  Decreased range of motion, Increased muscle spasms, Pain, Impaired flexibility, Decreased strength  Visit Diagnosis: Cramp and spasm  Chronic bilateral low back pain without sciatica  Muscle weakness (generalized)     Problem List Patient Active Problem List   Diagnosis Date Noted   Chronic anticoagulation 04/11/2021   Essential hypertension 04/11/2021   Controlled type 2 diabetes mellitus with complication, without long-term current use of insulin ([facility]) 04/11/2021   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   Venous (peripheral) insufficiency 03/14/2021   Mayer Camel, PTA 08/03/21 1:13 PM  [facility] [place] [address] [place], [place], [address] Phone: [phone]   Fax:  [phone]  Name: Natalie Carney MRN: [id number] Date of  Birth: [birth year]

## 2021-08-08 ENCOUNTER — Encounter: Payer: Self-pay | Admitting: Physical Therapy

## 2021-08-08 ENCOUNTER — Ambulatory Visit (INDEPENDENT_AMBULATORY_CARE_PROVIDER_SITE_OTHER): Payer: No Typology Code available for payment source | Admitting: Physical Therapy

## 2021-08-08 ENCOUNTER — Other Ambulatory Visit: Payer: Self-pay

## 2021-08-08 DIAGNOSIS — R252 Cramp and spasm: Secondary | ICD-10-CM

## 2021-08-08 DIAGNOSIS — G8929 Other chronic pain: Secondary | ICD-10-CM

## 2021-08-08 DIAGNOSIS — M545 Low back pain, unspecified: Secondary | ICD-10-CM

## 2021-08-08 DIAGNOSIS — M6281 Muscle weakness (generalized): Secondary | ICD-10-CM

## 2021-08-08 NOTE — Therapy (Signed)
[place], [place], [address] Phone: [phone]   Fax:  [phone]  Physical Therapy Treatment  Patient Details  Name: Natalie Carney MRN: [id number] Date of Birth: May 15, 1956 Referring Provider (PT): Vivien Rota MD   Encounter Date: 08/08/2021   PT End of Session - 08/08/21 0935     Visit Number 5    Number of Visits 12    Date for PT Re-Evaluation 09/05/21    PT Start Time 0930    PT Stop Time 1011    PT Time Calculation (min) 41 min    Activity Tolerance Patient tolerated treatment well    Behavior During Therapy [facility] for tasks assessed/performed             Past Medical History:  Diagnosis Date   Abnormal mammogram    Colon polyp    DVT (deep venous thrombosis) ([place])    Hypertension    Pulmonary embolism ([place])     Past Surgical History:  Procedure Laterality Date   ABDOMINAL HYSTERECTOMY     KNEE SURGERY Right    LAPAROSCOPIC GASTRIC BANDING     SHOULDER SURGERY Left     There were no vitals filed for this visit.   Subjective Assessment - 08/08/21 0936     Subjective Pt reports she was able to get her compression socks on/off without any difficulty yesterday. Her neck is still painful, but slowly improving.    Pertinent History DVTs bil (on blood thinners), hysterectomy 2005, had a lap band and then had it removed 5-6 yrs ago (after 3 yrs), two left shoulder surgeries and right knee surgery.  She    Currently in Pain? Yes    Pain Score 6     Pain Location Neck    Pain Orientation Left    Pain Descriptors / Indicators Sharp    Aggravating Factors  moving certain ways.    Pain Relieving Factors TENS, tigerbalm.                [facility] PT Assessment - 08/08/21 0001       Assessment   Medical Diagnosis rectus abdominus strain    Referring Provider (PT) Vivien Rota MD    Onset Date/Surgical Date 12/24/10    Hand Dominance Left    Next MD Visit 3 months      ROM /  Strength   AROM / PROM / Strength Strength      Strength   Overall Strength Comments Hip flexion 5/5 bilat, with Lt SI pain with MMT to LLE               [facility] Adult PT Treatment/Exercise - 08/08/21 0001       Lumbar Exercises: Stretches   Passive Hamstring Stretch Right;Left;2 reps;30 seconds   supine and seated   Lower Trunk Rotation 4 reps;10 seconds    Hip Flexor Stretch Right;Left;2 reps;30 seconds    Piriformis Stretch Right;Left;2 reps;20 seconds;30 seconds   seated   Other Lumbar Stretch Exercise Standing Hip flexor with overarm reach x 10 sec      Lumbar Exercises: Aerobic   Nustep L5: 6 min for warm up, legs/arms.      Lumbar Exercises: Supine   Clam 10 reps   with ab set   Dead Bug 15 reps    Bridge 10 reps;3 seconds    Bridge Limitations improved height    Other Supine Lumbar Exercises cervical rotation x 5 reps each direction.  sit to/from stand x 3 reps with controlled descent and no UE.    PT Education - 08/08/21 1042     Education Details Updated HEP, issued info on body mechanics.    Person(s) Educated Patient    Methods Explanation;Demonstration;Tactile cues;Verbal cues;Handout    Comprehension Verbalized understanding;Returned demonstration                 PT Long Term Goals - 08/08/21 1048       PT LONG TERM GOAL #1   Title Ind with advanced HEP    Time 6    Period Weeks    Status On-going      PT LONG TERM GOAL #2   Title Patient to report decreased frequency and intensity of abdominal cramping by >= 50% with ADLS.    Time 6    Period Weeks    Status Partially Met      PT LONG TERM GOAL #3   Title Patient able to tie her shoes without abdominal cramping.    Time 6    Period Weeks    Status On-going      PT LONG TERM GOAL #4   Title Patient to demo improved lumbar flexion and lateral flexion to [facility] to decrease strain on her back and hips.    Time 6    Period Weeks    Status On-going      PT LONG TERM GOAL  #5   Title Patient to demo 5/5 end range hip flexion to decrease strain on hip flexors. (eg from 90 deg and above)    Time 6    Period Weeks    Status Achieved                   Plan - 08/08/21 1042     Clinical Impression Statement Pt reporting improved ability to don compression socks without cramping.  She has not worn shoes that tie, so unable to evaluate LTG 3.  Pt's neck pain continues to be a barrier; will be evaluated in future session.  She is tolerating lumbar/ hip exercises well. Lt hip/ hamstring remains tight.  Progressing towards goals.    Comorbidities DVTs bil (on blood thinners), hysterectomy 2005, had a lap band and then had it removed 5-6 yrs ago (after 3 yrs), two left shoulder surgeries and right knee surgery    Rehab Potential Excellent    PT Frequency 2x / week    PT Duration 6 weeks    PT Treatment/Interventions ADLs/Self Care Home Management;Aquatic Therapy;Cryotherapy;Electrical Stimulation;Moist Heat;Therapeutic exercise;Therapeutic activities;Patient/family education;Manual techniques;Spinal Manipulations;Joint Manipulations;Taping    PT Next Visit Plan continue hip stretching/ strengthening, core work.  eval neck when available.    PT Home Exercise Plan [email or web address]             Patient will benefit from skilled therapeutic intervention in order to improve the following deficits and impairments:  Decreased range of motion, Increased muscle spasms, Pain, Impaired flexibility, Decreased strength  Visit Diagnosis: Cramp and spasm  Chronic bilateral low back pain without sciatica  Muscle weakness (generalized)     Problem List Patient Active Problem List   Diagnosis Date Noted   Chronic anticoagulation 04/11/2021   Essential hypertension 04/11/2021   Controlled type 2 diabetes mellitus with complication, without long-term current use of insulin ([place]) 04/11/2021   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

## 2021-08-08 NOTE — Patient Instructions (Addendum)
Access Code: [email or web address] URL: [email or web address] Date: 08/08/2021 Prepared by: [facility] [place]  Exercises Sidelying Open Book Thoracic Lumbar Rotation and Extension - 1 x daily - 7 x weekly - 3 sets - 10 reps Supine Bridge - 2 x daily - 7 x weekly - 1-3 sets - 10 reps Dead Bug - 1 x daily - 7 x weekly - 3 sets - 10 reps Supine Hamstring Stretch with Strap - 1 x daily - 7 x weekly - 1 sets - 2 reps - 20-30 seconds hold Seated Piriformis Stretch - 2 x daily - 7 x weekly - 1 sets - 2 reps - 20 seconds hold Standing Quadratus Lumborum Mobilization with Small Ball on Wall - 1 x daily - 7 x weekly - 3 sets - 10 reps  Sleeping on Back  Place pillow under knees. A pillow with cervical support and a roll around waist are also helpful. Copyright  VHI. All rights reserved.  Sleeping on Side Place pillow between knees. Use cervical support under neck and a roll around waist as needed. Copyright  VHI. All rights reserved.   Sleeping on Stomach   If this is the only desirable sleeping position, place pillow under lower legs, and under stomach or chest as needed.  Posture - Sitting   Sit upright, head facing forward. Try using a roll to support lower back. Keep shoulders relaxed, and avoid rounded back. Keep hips level with knees. Avoid crossing legs for long periods. Stand to Sit / Sit to Stand   To sit: Bend knees to lower self onto front edge of chair, then scoot back on seat. To stand: Reverse sequence by placing one foot forward, and scoot to front of seat. Use rocking motion to stand up.   Work Height and Reach  Ideal work height is no more than 2 to 4 inches below elbow level when standing, and at elbow level when sitting. Reaching should be limited to arm's length, with elbows slightly bent.  Bending  Bend at hips and knees, not back. Keep feet shoulder-width apart.    Posture - Standing   Good posture is important. Avoid slouching and  forward head thrust. Maintain curve in low back and align ears over shoul- ders, hips over ankles.  Alternating Positions   Alternate tasks and change positions frequently to reduce fatigue and muscle tension. Take rest breaks. Computer Work   Position work to Art gallery manager. Use proper work and seat height. Keep shoulders back and down, wrists straight, and elbows at right angles. Use chair that provides full back support. Add footrest and lumbar roll as needed.  Getting Into / Out of Car  Lower self onto seat, scoot back, then bring in one leg at a time. Reverse sequence to get out.  Dressing  Lie on back to pull socks or slacks over feet, or sit and bend leg while keeping back straight.    Housework - Sink  Place one foot on ledge of cabinet under sink when standing at sink for prolonged periods.   Pushing / Pulling  Pushing is preferable to pulling. Keep back in proper alignment, and use leg muscles to do the work.  Deep Squat   Squat and lift with both arms held against upper trunk. Tighten stomach muscles without holding breath. Use smooth movements to avoid jerking.  Avoid Twisting   Avoid twisting or bending back. Pivot around using foot movements, and bend at knees if needed when reaching for articles.  Carrying  Luggage   Distribute weight evenly on both sides. Use a cart whenever possible. Do not twist trunk. Move body as a unit.   Lifting Principles Maintain proper posture and head alignment. Slide object as close as possible before lifting. Move obstacles out of the way. Test before lifting; ask for help if too heavy. Tighten stomach muscles without holding breath. Use smooth movements; do not jerk. Use legs to do the work, and pivot with feet. Distribute the work load symmetrically and close to the center of trunk. Push instead of pull whenever possible.   Ask For Help   Ask for help and delegate to others when possible. Coordinate your movements  when lifting together, and maintain the low back curve.  Log Roll   Lying on back, bend left knee and place left arm across chest. Roll all in one movement to the right. Reverse to roll to the left. Always move as one unit. Housework - Sweeping  Use long-handled equipment to avoid stooping.   Housework - Wiping  Position yourself as close as possible to reach work surface. Avoid straining your back.  Laundry - Unloading Wash   To unload small items at bottom of washer, lift leg opposite to arm being used to reach.  Gardening - Raking  Move close to area to be raked. Use arm movements to do the work. Keep back straight and avoid twisting.     Cart  When reaching into cart with one arm, lift opposite leg to keep back straight.   Getting Into / Out of Bed  Lower self to lie down on one side by raising legs and lowering head at the same time. Use arms to assist moving without twisting. Bend both knees to roll onto back if desired. To sit up, start from lying on side, and use same move-ments in reverse. Housework - Vacuuming  Hold the vacuum with arm held at side. Step back and forth to move it, keeping head up. Avoid twisting.   Laundry - Armed forces training and education officer so that bending and twisting can be avoided.   Laundry - Unloading Dryer  Squat down to reach into clothes dryer or use a reacher.  Gardening - Weeding / Psychiatric nurse or Kneel. Knee pads may be helpful.

## 2021-08-10 ENCOUNTER — Encounter: Payer: No Typology Code available for payment source | Admitting: Physical Therapy

## 2021-08-11 ENCOUNTER — Ambulatory Visit (INDEPENDENT_AMBULATORY_CARE_PROVIDER_SITE_OTHER): Payer: No Typology Code available for payment source | Admitting: Physical Therapy

## 2021-08-11 ENCOUNTER — Other Ambulatory Visit: Payer: Self-pay

## 2021-08-11 DIAGNOSIS — M6281 Muscle weakness (generalized): Secondary | ICD-10-CM

## 2021-08-11 DIAGNOSIS — R252 Cramp and spasm: Secondary | ICD-10-CM

## 2021-08-11 DIAGNOSIS — G8929 Other chronic pain: Secondary | ICD-10-CM

## 2021-08-11 DIAGNOSIS — M542 Cervicalgia: Secondary | ICD-10-CM

## 2021-08-11 DIAGNOSIS — M545 Low back pain, unspecified: Secondary | ICD-10-CM | POA: Diagnosis not present

## 2021-08-11 NOTE — Therapy (Signed)
[facility] [place] [address] [place], [place], [address] Phone: [phone]   Fax:  [phone]  Physical Therapy Treatment and Recertification  Patient Details  Name: Natalie Carney MRN: [id number] Date of Birth: 06-Aug-1956 Referring Provider (PT): Leroy Libman MD   Encounter Date: 08/11/2021   PT End of Session - 08/11/21 1053     Visit Number 6    Number of Visits 18    Date for PT Re-Evaluation 09/22/21    PT Start Time 1015    PT Stop Time 1055    PT Time Calculation (min) 40 min    Activity Tolerance Patient tolerated treatment well    Behavior During Therapy [facility] for tasks assessed/performed             Past Medical History:  Diagnosis Date   Abnormal mammogram    Colon polyp    DVT (deep venous thrombosis) (HCC)    Hypertension    Pulmonary embolism (HCC)     Past Surgical History:  Procedure Laterality Date   ABDOMINAL HYSTERECTOMY     KNEE SURGERY Right    LAPAROSCOPIC GASTRIC BANDING     SHOULDER SURGERY Left     There were no vitals filed for this visit.   Subjective Assessment - 08/11/21 1018     Subjective Pt states her back is feeling better and that her neck hurts "all the time" but it is better than it was    Patient Stated Goals get rid of pain    Currently in Pain? Yes    Pain Score 5     Pain Location Back    Pain Orientation Lower    Pain Descriptors / Indicators Aching;Sore    Pain Type Chronic pain                [facility] PT Assessment - 08/11/21 0001       Assessment   Medical Diagnosis rectus abdominus strain    Referring Provider (PT) Leroy Libman MD    Onset Date/Surgical Date 12/24/10    Hand Dominance Left    Next MD Visit 3 months      AROM   AROM Assessment Site Cervical    Cervical Flexion 55    Cervical Extension 30    Cervical - Right Side Bend 20    Cervical - Left Side Bend 15    Cervical - Right Rotation 61    Cervical - Left Rotation 51       Strength   Overall Strength Comments bilat shoulder strength grossly 4/5      Palpation   Spinal mobility difficult to assess due to cervical mm spasticity    Palpation comment increased mm spasticity Lt suboccipitals and cervical paraspinals throughout - very tender to touch                           [facility] Adult PT Treatment/Exercise - 08/11/21 0001       Exercises   Exercises Neck      Neck Exercises: Supine   Neck Retraction 5 reps;5 secs      Neck Exercises: Stretches   Levator Stretch Right;Left;2 reps;20 seconds      Lumbar Exercises: Stretches   Passive Hamstring Stretch Right;Left;2 reps;20 seconds    Passive Hamstring Stretch Limitations seated    Lower Trunk Rotation 4 reps;10 seconds    Hip Flexor Stretch Right;Left;2 reps;30 seconds      Lumbar  Exercises: Aerobic   Nustep L5: 5 min for warm up UEs and LEs      Lumbar Exercises: Supine   Clam 10 reps   with ab set   Dead Bug 15 reps    Bridge 3 seconds;15 reps                    PT Education - 08/11/21 1052     Education Details updated HEP    Person(s) Educated Patient    Methods Explanation;Demonstration;Handout    Comprehension Returned demonstration;Verbalized understanding                 PT Long Term Goals - 08/11/21 1036       Additional Long Term Goals   Additional Long Term Goals Yes      PT LONG TERM GOAL #6   Title Pt will improve cervical rotation and sidebending ROM by 15 degrees bilaterally    Time 6    Period Weeks    Status New    Target Date 09/22/21                   Plan - 08/11/21 1055     Clinical Impression Statement Pt is improving low back and abdominal pain but is limited by cervical pain on Lt side. Pt with decreased cervical ROM and decreased shoulder strength. Pt will benefit from continued PT to address deficits in abdominals, back and cervical spine    Comorbidities DVTs bil (on blood thinners), hysterectomy 2005, had a  lap band and then had it removed 5-6 yrs ago (after 3 yrs), two left shoulder surgeries and right knee surgery    Examination-Activity Limitations Bend;Dressing;Other    PT Frequency 2x / week    PT Duration 6 weeks    PT Treatment/Interventions ADLs/Self Care Home Management;Aquatic Therapy;Cryotherapy;Electrical Stimulation;Moist Heat;Therapeutic exercise;Therapeutic activities;Patient/family education;Manual techniques;Spinal Manipulations;Joint Manipulations;Taping;Neuromuscular re-education    PT Next Visit Plan assess neck HEP and progress cervical ROM and strength as indicated    PT Home Exercise Plan [email or web address]    Consulted and Agree with Plan of Care Patient             Patient will benefit from skilled therapeutic intervention in order to improve the following deficits and impairments:  Decreased range of motion, Increased muscle spasms, Pain, Impaired flexibility, Decreased strength  Visit Diagnosis: Chronic bilateral low back pain without sciatica - Plan: PT plan of care cert/re-cert  Cramp and spasm - Plan: PT plan of care cert/re-cert  Muscle weakness (generalized) - Plan: PT plan of care cert/re-cert  Cervicalgia - Plan: PT plan of care cert/re-cert     Problem List Patient Active Problem List   Diagnosis Date Noted   Chronic anticoagulation 04/11/2021   Essential hypertension 04/11/2021   Controlled type 2 diabetes mellitus with complication, without long-term current use of insulin (HCC) 04/11/2021   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   Venous (peripheral) insufficiency 03/14/2021   Hilbert Briggs, PT  Thatcher Doberstein 08/11/2021, 10:59 AM  [facility] [address] [place], [place], [address] Phone: [phone]   Fax:  [phone]  Name: Elienai Gailey MRN: [id number] Date of Birth: Jul 19, 1956

## 2021-08-11 NOTE — Patient Instructions (Signed)
Access Code: [email or web address] URL: [email or web address] Date: 08/11/2021 Prepared by: Reggy Eye  Exercises Sidelying Open Book Thoracic Lumbar Rotation and Extension - 1 x daily - 7 x weekly - 3 sets - 10 reps Supine Bridge - 2 x daily - 7 x weekly - 1-3 sets - 10 reps Dead Bug - 1 x daily - 7 x weekly - 3 sets - 10 reps Supine Hamstring Stretch with Strap - 1 x daily - 7 x weekly - 1 sets - 2 reps - 20-30 seconds hold Seated Piriformis Stretch - 2 x daily - 7 x weekly - 1 sets - 2 reps - 20 seconds hold Standing Quadratus Lumborum Mobilization with Small Ball on Wall - 1 x daily - 7 x weekly - 3 sets - 10 reps Supine Chin Tuck - 1 x daily - 7 x weekly - 1 sets - 10 reps - 5 seconds hold Gentle Levator Scapulae Stretch - 1 x daily - 7 x weekly - 3 sets - 1 reps - 20-30 seconds hold

## 2021-08-15 ENCOUNTER — Other Ambulatory Visit: Payer: Self-pay

## 2021-08-15 ENCOUNTER — Ambulatory Visit (INDEPENDENT_AMBULATORY_CARE_PROVIDER_SITE_OTHER): Payer: No Typology Code available for payment source | Admitting: Physical Therapy

## 2021-08-15 DIAGNOSIS — G8929 Other chronic pain: Secondary | ICD-10-CM

## 2021-08-15 DIAGNOSIS — M6281 Muscle weakness (generalized): Secondary | ICD-10-CM

## 2021-08-15 DIAGNOSIS — M545 Low back pain, unspecified: Secondary | ICD-10-CM | POA: Diagnosis not present

## 2021-08-15 DIAGNOSIS — M542 Cervicalgia: Secondary | ICD-10-CM

## 2021-08-15 DIAGNOSIS — R252 Cramp and spasm: Secondary | ICD-10-CM

## 2021-08-15 NOTE — Therapy (Signed)
[facility] [place], [place], [address] Phone: [phone]   Fax:  [phone]  Physical Therapy Treatment  Patient Details  Name: Natalie Carney MRN: [id number] Date of Birth: 08-09-1956 Referring Provider (PT): Vivien Rota MD   Encounter Date: 08/15/2021   PT End of Session - 08/15/21 0851     Visit Number 7    Number of Visits 18    Date for PT Re-Evaluation 09/22/21    PT Start Time 0847    PT Stop Time 0927    PT Time Calculation (min) 40 min    Activity Tolerance Patient tolerated treatment well    Behavior During Therapy [facility] for tasks assessed/performed             Past Medical History:  Diagnosis Date   Abnormal mammogram    Colon polyp    DVT (deep venous thrombosis) ([place])    Hypertension    Pulmonary embolism ([place])     Past Surgical History:  Procedure Laterality Date   ABDOMINAL HYSTERECTOMY     KNEE SURGERY Right    LAPAROSCOPIC GASTRIC BANDING     SHOULDER SURGERY Left     There were no vitals filed for this visit.   Subjective Assessment - 08/15/21 0852     Subjective Pt reports that the levator stretch has been painful.  She continues to use TENS for relief in neck and low back.  She is able to get compression socks on without abdominal cramping. Overall, pleased with progress. "I think my neck will eventually be alright".    Pertinent History DVTs bil (on blood thinners), hysterectomy 2005, had a lap band and then had it removed 5-6 yrs ago (after 3 yrs), two left shoulder surgeries and right knee surgery.  She    Patient Stated Goals get rid of pain    Currently in Pain? Yes    Pain Score 3     Pain Location Neck    Pain Orientation Left;Lateral;Posterior    Pain Descriptors / Indicators Sore;Tightness    Aggravating Factors  moving neck certain ways.    Pain Relieving Factors TENS                [facility] PT Assessment - 08/15/21 0001       Assessment   Medical  Diagnosis rectus abdominus strain    Referring Provider (PT) Vivien Rota MD    Onset Date/Surgical Date 12/24/10    Hand Dominance Left      AROM   Overall AROM Comments Lumbar flexion 80%, Lt rotation limited by 50%, Rt rotation limted by 25%, side bending WNL                [facility] Adult PT Treatment/Exercise - 08/15/21 0001       Neck Exercises: Seated   Neck Retraction 5 reps;3 secs    Cervical Rotation Right;Left;5 reps    Lateral Flexion Left;Right;5 reps    Shoulder Rolls Backwards;Forwards;5 reps      Neck Exercises: Stretches   Other Neck Stretches 3 position doorway stretch x 15 sec x 2 reps each, with cues for form.      Lumbar Exercises: Stretches   Passive Hamstring Stretch Right;Left;2 reps;20 seconds    Passive Hamstring Stretch Limitations seated hip hinge    Lower Trunk Rotation 4 reps;10 seconds   with arms in T and gentle cerv rotation   Hip Flexor Stretch Right;Left;2 reps;20 seconds   with arm overhead,  seated in chair.     Lumbar Exercises: Aerobic   Nustep L5: 5.5 min for warm up UEs and LEs      Lumbar Exercises: Standing   Row Strengthening;Both;10 reps;Theraband   core engaged, staggered stance   Theraband Level (Row) Level 3 (Green)      Lumbar Exercises: Supine   Dead Bug 10 reps   with arm/leg reaching towards walls.     Manual Therapy   Soft tissue mobilization IASTM to Lt upper trap, levator and cervical erectors  to decrease fascial restriction and improve ROM                        PT Long Term Goals - 08/15/21 0906       PT LONG TERM GOAL #1   Title Ind with advanced HEP    Time 6    Period Weeks    Status On-going      PT LONG TERM GOAL #2   Title Patient to report decreased frequency and intensity of abdominal cramping by >= 50% with ADLS.    Time 6    Period Weeks    Status Partially Met      PT LONG TERM GOAL #3   Title Patient able to tie her shoes without abdominal cramping.    Time 6    Period  Weeks    Status On-going      PT LONG TERM GOAL #4   Title Patient to demo improved lumbar flexion and lateral flexion to [facility] to decrease strain on her back and hips.    Time 6    Period Weeks    Status On-going      PT LONG TERM GOAL #5   Title Patient to demo 5/5 end range hip flexion to decrease strain on hip flexors. (eg from 90 deg and above)    Time 6    Period Weeks    Status Achieved      PT LONG TERM GOAL #6   Title Pt will improve cervical rotation and sidebending ROM by 15 degrees bilaterally    Time 6    Period Weeks    Status On-going                   Plan - 08/15/21 1324     Clinical Impression Statement Pt's lumbar ROM improving, cervical ROM slowly improving.  Pt point tender in Lt upper trap; limited tolerance to IASTM to area, but improved cervical rotation ROM afterwards.  Progressing well towards goals.    Comorbidities DVTs bil (on blood thinners), hysterectomy 2005, had a lap band and then had it removed 5-6 yrs ago (after 3 yrs), two left shoulder surgeries and right knee surgery    Examination-Activity Limitations Bend;Dressing;Other    PT Frequency 2x / week    PT Duration 6 weeks    PT Treatment/Interventions ADLs/Self Care Home Management;Aquatic Therapy;Cryotherapy;Electrical Stimulation;Moist Heat;Therapeutic exercise;Therapeutic activities;Patient/family education;Manual techniques;Spinal Manipulations;Joint Manipulations;Taping;Neuromuscular re-education    PT Next Visit Plan progress cervical/lumbar ROM and strength as indicated    PT Home Exercise Plan [email or web address]    Consulted and Agree with Plan of Care Patient             Patient will benefit from skilled therapeutic intervention in order to improve the following deficits and impairments:  Decreased range of motion, Increased muscle spasms, Pain, Impaired flexibility, Decreased strength  Visit Diagnosis: Chronic bilateral low back pain without sciatica  Cramp  and  spasm  Cervicalgia  Muscle weakness (generalized)     Problem List Patient Active Problem List   Diagnosis Date Noted   Chronic anticoagulation 04/11/2021   Essential hypertension 04/11/2021   Controlled type 2 diabetes mellitus with complication, without long-term current use of insulin ([place]) 04/11/2021   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   Venous (peripheral) insufficiency 03/14/2021    Kerin Perna, PTA 08/15/21 9:33 AM   [place], [place], [address] Phone: [phone]   Fax:  [phone]  Name: Natalie Carney MRN: [id number] Date of Birth: 1956/03/12

## 2021-08-17 ENCOUNTER — Ambulatory Visit: Payer: No Typology Code available for payment source | Admitting: Physical Therapy

## 2021-08-17 DIAGNOSIS — M545 Low back pain, unspecified: Secondary | ICD-10-CM

## 2021-08-17 DIAGNOSIS — R252 Cramp and spasm: Secondary | ICD-10-CM | POA: Diagnosis not present

## 2021-08-17 DIAGNOSIS — M6281 Muscle weakness (generalized): Secondary | ICD-10-CM

## 2021-08-17 DIAGNOSIS — G8929 Other chronic pain: Secondary | ICD-10-CM

## 2021-08-17 DIAGNOSIS — M542 Cervicalgia: Secondary | ICD-10-CM | POA: Diagnosis not present

## 2021-08-17 NOTE — Therapy (Signed)
[facility] [place] [address] [place] [address] [place], [place], [address] Phone: [phone]   Fax:  [phone]  Physical Therapy Treatment  Patient Details  Name: Natalie Carney MRN: [id number] Date of Birth: 10-08-1956 Referring Provider (PT): Leroy Libman MD   Encounter Date: 08/17/2021   PT End of Session - 08/17/21 1108     Visit Number 8    Number of Visits 18    Date for PT Re-Evaluation 09/22/21    PT Start Time 1101    PT Stop Time 1140    PT Time Calculation (min) 39 min    Activity Tolerance Patient tolerated treatment well    Behavior During Therapy [facility] for tasks assessed/performed             Past Medical History:  Diagnosis Date   Abnormal mammogram    Colon polyp    DVT (deep venous thrombosis) (HCC)    Hypertension    Pulmonary embolism (HCC)     Past Surgical History:  Procedure Laterality Date   ABDOMINAL HYSTERECTOMY     KNEE SURGERY Right    LAPAROSCOPIC GASTRIC BANDING     SHOULDER SURGERY Left     There were no vitals filed for this visit.   Subjective Assessment - 08/17/21 1106     Subjective Pt reports she hasn't had any abdominal cramping since initial eval.  She reports her neck is no longer painful, just tight.  "I almost didn't come today, but my husband convinced me".    Currently in Pain? No/denies    Pain Score 0-No pain                [facility] PT Assessment - 08/17/21 0001       Assessment   Medical Diagnosis rectus abdominus strain    Referring Provider (PT) Leroy Libman MD    Onset Date/Surgical Date 12/24/10    Hand Dominance Left      AROM   Cervical Extension 42    Cervical - Right Side Bend 45    Cervical - Left Side Bend 20               [facility] Adult PT Treatment/Exercise - 08/17/21 0001       Neck Exercises: Seated   Cervical Rotation Right;Left;5 reps    Lateral Flexion Right;Left;5 reps    Shoulder Rolls Backwards;5 reps    Other Seated Exercise scap  retraction (noodle behind back) x 5 sec x 5 reps; W's x 5 sec x 5 reps      Neck Exercises: Stretches   Other Neck Stretches 3 position doorway stretch x 15 sec x 2 reps each, with cues for form.  (high position performed unilaterally).  pec stretch with arm abdct, elbow straight, on wall x 15 sec x 2 LUE, 1 rep RUE.    Other Neck Stretches open book with Lt rotation x 5 reps      Manual Therapy   Manual therapy comments Pt began cramping in Lt low back when in hooklying on firm table; resolve with position change to Rt sidelying.    Soft tissue mobilization IASTM to Lt upper trap, levator and cervical erectors, STM and TPR to Lt cervical erectors, levator, rhomboid.                         PT Long Term Goals - 08/17/21 1148       PT LONG TERM GOAL #1  Title Ind with advanced HEP    Time 6    Period Weeks    Status On-going      PT LONG TERM GOAL #2   Title Patient to report decreased frequency and intensity of abdominal cramping by >= 50% with ADLS.    Time 6    Period Weeks    Status Achieved      PT LONG TERM GOAL #3   Title Patient able to tie her shoes without abdominal cramping.    Time 6    Period Weeks    Status Achieved      PT LONG TERM GOAL #4   Title Patient to demo improved lumbar flexion and lateral flexion to [facility] to decrease strain on her back and hips.    Time 6    Period Weeks    Status On-going      PT LONG TERM GOAL #5   Title Patient to demo 5/5 end range hip flexion to decrease strain on hip flexors. (eg from 90 deg and above)    Time 6    Period Weeks    Status Achieved      PT LONG TERM GOAL #6   Title Pt will improve cervical rotation and sidebending ROM by 15 degrees bilaterally    Time 6    Period Weeks    Status Partially Met                   Plan - 08/17/21 1145     Clinical Impression Statement Pt demonstrating improved neck ROM, however continued limitation and pain on Lt side of neck with Lt lateral  flexion.  She is responding well to manual technques; requires light touch at times.  One episode of cramping in Lt low back in hooklying, but otherwise she has had no cramping in abdominals in weeks.  Has met LTG 2 and 3.    Comorbidities DVTs bil (on blood thinners), hysterectomy 2005, had a lap band and then had it removed 5-6 yrs ago (after 3 yrs), two left shoulder surgeries and right knee surgery    Examination-Activity Limitations Bend;Dressing;Other    PT Frequency 2x / week    PT Duration 6 weeks    PT Treatment/Interventions ADLs/Self Care Home Management;Aquatic Therapy;Cryotherapy;Electrical Stimulation;Moist Heat;Therapeutic exercise;Therapeutic activities;Patient/family education;Manual techniques;Spinal Manipulations;Joint Manipulations;Taping;Neuromuscular re-education    PT Next Visit Plan progress cervical/lumbar ROM and strength as indicated.  NO DN (per pt, on blood thinners).  Trial cervical mobs.    PT Home Exercise Plan [email or web address]    Consulted and Agree with Plan of Care Patient             Patient will benefit from skilled therapeutic intervention in order to improve the following deficits and impairments:  Decreased range of motion, Increased muscle spasms, Pain, Impaired flexibility, Decreased strength  Visit Diagnosis: Chronic bilateral low back pain without sciatica  Cramp and spasm  Cervicalgia  Muscle weakness (generalized)     Problem List Patient Active Problem List   Diagnosis Date Noted   Chronic anticoagulation 04/11/2021   Essential hypertension 04/11/2021   Controlled type 2 diabetes mellitus with complication, without long-term current use of insulin ([place]) 04/11/2021   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   Venous (peripheral) insufficiency 03/14/2021   Kerin Perna, PTA 08/17/21 11:56 AM   [place], [place], [address] Phone: [phone]   Fax:   [phone]  Name: Natalie Carney MRN: [id number] Date of  Birth: Jan 10, 1956

## 2021-08-22 ENCOUNTER — Encounter: Payer: No Typology Code available for payment source | Admitting: Physical Therapy

## 2021-08-24 ENCOUNTER — Ambulatory Visit: Payer: No Typology Code available for payment source | Admitting: Physical Therapy

## 2021-08-24 ENCOUNTER — Other Ambulatory Visit: Payer: Self-pay

## 2021-08-24 DIAGNOSIS — R252 Cramp and spasm: Secondary | ICD-10-CM | POA: Diagnosis not present

## 2021-08-24 DIAGNOSIS — M6281 Muscle weakness (generalized): Secondary | ICD-10-CM

## 2021-08-24 DIAGNOSIS — M545 Low back pain, unspecified: Secondary | ICD-10-CM

## 2021-08-24 DIAGNOSIS — M542 Cervicalgia: Secondary | ICD-10-CM

## 2021-08-24 DIAGNOSIS — G8929 Other chronic pain: Secondary | ICD-10-CM

## 2021-08-24 NOTE — Therapy (Addendum)
[place], [place], [address] Phone: [phone]   Fax:  [phone]  Physical Therapy Treatment and Discharge  Patient Details  Name: Natalie Carney MRN: [id number] Date of Birth: [birth year] Referring Provider (PT): Vivien Rota MD   Encounter Date: 08/24/2021   PT End of Session - 08/24/21 0942     Visit Number 9    Number of Visits 18    Date for PT Re-Evaluation 09/22/21    PT Start Time 0932    PT Stop Time 1011    PT Time Calculation (min) 39 min    Activity Tolerance Patient tolerated treatment well    Behavior During Therapy [facility] for tasks assessed/performed             Past Medical History:  Diagnosis Date   Abnormal mammogram    Colon polyp    DVT (deep venous thrombosis) ([place])    Hypertension    Pulmonary embolism ([place])     Past Surgical History:  Procedure Laterality Date   ABDOMINAL HYSTERECTOMY     KNEE SURGERY Right    LAPAROSCOPIC GASTRIC BANDING     SHOULDER SURGERY Left     There were no vitals filed for this visit.   Subjective Assessment - 08/24/21 0933     Subjective Pt reports she had a cramp on Monday when taking off her compression socks. Lasted 10 sec; immediately stood up and stretched to release it.  Neck still has some stiffness and tenderness behind ear, but over all "it's very good".  Pt verbalized readiness to hold therapy after today.    Pertinent History DVTs bil (on blood thinners), hysterectomy 2005, had a lap band and then had it removed 5-6 yrs ago (after 3 yrs), two left shoulder surgeries and right knee surgery.    Patient Stated Goals get rid of pain    Currently in Pain? Yes    Pain Score 3     Pain Location Neck    Pain Orientation Left;Lateral;Proximal    Pain Descriptors / Indicators Sore    Aggravating Factors  end range rotation to Lt    Pain Relieving Factors TENS                [facility] PT Assessment - 08/24/21 0001        Assessment   Medical Diagnosis rectus abdominus strain    Referring Provider (PT) Vivien Rota MD    Onset Date/Surgical Date 12/24/10    Hand Dominance Left      AROM   Overall AROM Comments Lumbar flexion 80%, Lt rotation limited by 50%, Rt rotation WNL, side bending WNL    Cervical - Right Side Bend 35    Cervical - Left Side Bend 37    Cervical - Right Rotation 62    Cervical - Left Rotation 58              [facility] Adult PT Treatment/Exercise - 08/24/21 0001       Neck Exercises: Stretches   Other Neck Stretches 3 position doorway stretch x 15 sec x 2 reps each, with cues for form.  (high position performed bilat and unilaterally). bilat bicep stretch on door x 15 sec.      Lumbar Exercises: Stretches   Lower Trunk Rotation 4 reps;10 seconds    Hip Flexor Stretch Right;Left;2 reps;20 seconds   with arm overhead   Prone on Elbows Stretch 1 rep;60 seconds   with cervical flexion  to neutral, and diagonals.     Lumbar Exercises: Aerobic   Nustep L5: 4 min for warm up, arms/legs.      Lumbar Exercises: Supine   Clam 10 reps   with ab set, green band on thighs   Dead Bug 10 reps   with arm/leg reaching towards walls.   Bridge 10 reps      Manual Therapy   Manual Therapy Passive ROM;Soft tissue mobilization    Soft tissue mobilization STM to Lt upper trap, paraspinals, bilat suboccipitals.    Passive ROM Rt lateral flexion and Rt rotation (pin + stretch).                         PT Long Term Goals - 08/24/21 1020       PT LONG TERM GOAL #1   Title Ind with advanced HEP    Time 6    Period Weeks    Status On-going      PT LONG TERM GOAL #2   Title Patient to report decreased frequency and intensity of abdominal cramping by >= 50% with ADLS.    Time 6    Period Weeks    Status Achieved      PT LONG TERM GOAL #3   Title Patient able to tie her shoes without abdominal cramping.    Time 6    Period Weeks    Status Achieved      PT LONG TERM GOAL #4    Title Patient to demo improved lumbar flexion and lateral flexion to [facility] to decrease strain on her back and hips.    Time 6    Period Weeks    Status Partially Met      PT LONG TERM GOAL #5   Title Patient to demo 5/5 end range hip flexion to decrease strain on hip flexors. (eg from 90 deg and above)    Time 6    Period Weeks    Status Achieved      PT LONG TERM GOAL #6   Title Pt will improve cervical rotation and sidebending ROM by 15 degrees bilaterally    Time 6    Period Weeks    Status Partially Met                   Plan - 08/24/21 1021     Clinical Impression Statement Improved Lt cervical lateral flexion and Lt lumbar rotation ROM.  She tolerated all exercises well without production of symptoms.  Tight and tender in Lt cervical paraspinals, levator scapula with manual therapy.  Pt has partially met her goals but is pleased with current level of function.    Comorbidities DVTs bil (on blood thinners), hysterectomy 2005, had a lap band and then had it removed 5-6 yrs ago (after 3 yrs), two left shoulder surgeries and right knee surgery    Examination-Activity Limitations Bend;Dressing;Other    PT Frequency 2x / week    PT Duration 6 weeks    PT Treatment/Interventions ADLs/Self Care Home Management;Aquatic Therapy;Cryotherapy;Electrical Stimulation;Moist Heat;Therapeutic exercise;Therapeutic activities;Patient/family education;Manual techniques;Spinal Manipulations;Joint Manipulations;Taping;Neuromuscular re-education    PT Next Visit Plan Will hold until 09/22/21; if pt has flare up will resume work towards [place].    PT Home Exercise Plan [id number]    Consulted and Agree with Plan of Care Patient             Patient will benefit from skilled therapeutic intervention in order to improve  the following deficits and impairments:  Decreased range of motion, Increased muscle spasms, Pain, Impaired flexibility, Decreased strength  Visit Diagnosis: Cramp and  spasm  Cervicalgia  Chronic bilateral low back pain without sciatica  Muscle weakness (generalized)     Problem List Patient Active Problem List   Diagnosis Date Noted   Chronic anticoagulation 04/11/2021   Essential hypertension 04/11/2021   Controlled type 2 diabetes mellitus with complication, without long-term current use of insulin ([place]) 04/11/2021   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 Patient goals were partially met. Patient is being discharged due to being pleased with the current functional level.  Isabelle Course, PT,DPT09/28/229:50 AM  Kerin Perna, PTA 08/24/21 10:25 AM   [facility] [place], [place], [address] Phone: [phone]   Fax:  [phone]  Name: Lorien Shingler MRN: [id number] Date of Birth: [birth year]

## 2021-08-26 ENCOUNTER — Other Ambulatory Visit: Payer: Self-pay

## 2021-08-26 ENCOUNTER — Emergency Department (INDEPENDENT_AMBULATORY_CARE_PROVIDER_SITE_OTHER)
Admission: EM | Admit: 2021-08-26 | Discharge: 2021-08-26 | Disposition: A | Discharge status: Home / Self Care | Payer: No Typology Code available for payment source | Source: Home / Self Care | Attending: Family Medicine | Admitting: Family Medicine

## 2021-08-26 ENCOUNTER — Emergency Department (INDEPENDENT_AMBULATORY_CARE_PROVIDER_SITE_OTHER): Payer: No Typology Code available for payment source

## 2021-08-26 DIAGNOSIS — W19XXXA Unspecified fall, initial encounter: Secondary | ICD-10-CM

## 2021-08-26 DIAGNOSIS — W228XXA Striking against or struck by other objects, initial encounter: Secondary | ICD-10-CM

## 2021-08-26 DIAGNOSIS — M79641 Pain in right hand: Secondary | ICD-10-CM

## 2021-08-26 DIAGNOSIS — S6991XA Unspecified injury of right wrist, hand and finger(s), initial encounter: Secondary | ICD-10-CM

## 2021-08-26 MED ORDER — HYDROCODONE-ACETAMINOPHEN 5-325 MG PO TABS: 1.0000 | ORAL_TABLET | Freq: Four times a day (QID) | ORAL | 0 refills | Status: DC | PRN

## 2021-08-26 NOTE — ED Provider Notes (Signed)
[facility]    CSN: [id number] Arrival date & time: 08/26/21  1213      History   Chief Complaint Chief Complaint  Patient presents with   Wrist Pain    Right     HPI Natalie Carney is a 65 y.o. female.   HPI  Patient fell on outstretched hand.  Injured her right hand.  She has pain in her mid hand over the second and third metacarpals.  Pain with movement of these fingers.  No soft tissue swelling is noted.  No bruising.  Good range of motion of the wrist.  No tenderness around the wrist bony structures.  Normal sensory exam of the fingers. 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 Authorizing Provider  HYDROcodone-acetaminophen (NORCO/VICODIN) 5-325 MG tablet Take 1-2 tablets by mouth every 6 (six) hours as needed. 08/26/21  Yes Eustace Moore, MD  Semaglutide,0.25 or 0.5MG /DOS, (OZEMPIC, 0.25 OR 0.5 MG/DOSE,) 2 MG/1.5ML SOPN Inject into the skin.   Yes [provider]  apixaban (ELIQUIS) 5 MG TABS tablet Take 5 mg by mouth 2 (two) times daily.    [provider]  Cholecalciferol (D3 2000) 50 MCG (2000 UT) CAPS Take by mouth.    [provider]  Cholecalciferol 25 MCG (1000 UT) tablet Take 1 tablet by mouth daily.    [provider]  esomeprazole (NEXIUM) 40 MG capsule Take 40 mg by mouth daily at 12 noon.    [provider]  irbesartan (AVAPRO) 300 MG tablet Take 300 mg by mouth daily. 07/28/21   [provider]  magnesium gluconate (MAGONATE) 500 MG tablet Take 500 mg by mouth daily.    [provider]  mirabegron ER (MYRBETRIQ) 25 MG TB24 tablet Take 1 tablet (25 mg total) by mouth daily. 04/11/21   Christen Butter, NP  multivitamin-lutein (OCUVITE-LUTEIN) CAPS capsule Take 1 capsule by mouth daily.    [provider]  ondansetron (ZOFRAN-ODT) 8 MG disintegrating tablet Take 1 tablet (8 mg total) by mouth every 8 (eight) hours as needed for nausea. 04/13/21   Christen Butter, NP    Family History Family History  Problem Relation Age of Onset   Diabetes Father    Hypertension Father  Heart attack Father    Breast cancer Maternal Aunt    Cervical cancer Maternal Grandmother    Breast cancer Other    Ovarian cancer Other     Social History Social History   Tobacco Use   Smoking status: Former    Types: Cigarettes    Quit date: 12/25/1987    Years since quitting: 33.6   Smokeless tobacco: Never  Vaping Use   Vaping Use: Never used  Substance Use Topics   Alcohol use: Never   Drug use: Never     Allergies   Nsaids   Review of Systems Review of Systems See HPI  Physical Exam Triage Vital Signs ED Triage Vitals  Enc Vitals Group     BP 08/26/21 1324 137/86     Pulse Rate 08/26/21 1324 68     Resp 08/26/21 1324 16     Temp 08/26/21 1324 99.1 F (37.3 C)     Temp Source 08/26/21 1324 Oral     SpO2 08/26/21 1324 100 %     Weight 08/26/21 1320 270 lb (122.5 kg)     Height 08/26/21 1320 5\' 8"  (1.727 m)     Head Circumference --      Peak Flow --      Pain Score 08/26/21 1320 10      Pain Loc --      Pain Edu? --      Excl. in GC? --    No data found.  Updated Vital Signs BP 137/86 (BP Location: Left Arm)   Pulse 68   Temp 99.1 F (37.3 C) (Oral)   Resp 16   Ht 5\' 8"  (1.727 m)   Wt 122.5 kg   SpO2 100%   BMI 41.05 kg/m      Physical Exam Constitutional:      General: She is not in acute distress.    Appearance: Normal appearance. She is well-developed.  HENT:     Head: Normocephalic and atraumatic.     Nose:     Comments: Mask is in place Eyes:     Conjunctiva/sclera: Conjunctivae normal.     Pupils: Pupils are equal, round, and reactive to light.  Cardiovascular:     Rate and Rhythm: Normal rate.  Pulmonary:     Effort: Pulmonary effort is normal. No respiratory distress.  Abdominal:     General: There is no distension.     Palpations: Abdomen is soft.  Musculoskeletal:        General: Normal range of motion.     Cervical back: Normal range of motion.     Comments: Patient appears uncomfortable.  Is holding the right hand close to her body cradle to her chest.  Tenderness over the third and fourth metacarpals and pain within his fingers.  Minimal soft tissue swelling over the dorsum of the hand globally.  No ecchymosis.  Wrist exam is normal with good range of motion  Skin:    General: Skin is warm and dry.  Neurological:     General: No focal deficit present.     Mental Status: She is alert.  Psychiatric:        Mood and Affect: Mood normal.        Behavior: Behavior normal.     UC Treatments / Results  Labs (all labs ordered are listed, but only abnormal results are displayed) Labs Reviewed - No data to display  EKG   Radiology DG Hand Complete Right  Result Date: 08/26/2021  CLINICAL DATA:  Right hand pain after injury, hit hand against a table EXAM: RIGHT HAND - COMPLETE 3+ VIEW COMPARISON:  None. FINDINGS: There is no evidence of fracture or dislocation. Arthropathy most pronounced at the second and third DIP joints.  Regional soft tissues are unremarkable. IMPRESSION: No acute fracture or dislocation. Electronically Signed   By: Emmaline Kluver M.D.   On: 08/26/2021 13:48    Procedures Procedures (including critical care time)  Medications Ordered in UC Medications - No data to display  Initial Impression / Assessment and Plan / UC Course  I have reviewed the triage vital signs and the nursing notes.  Pertinent labs & imaging results that were available during my care of the patient were reviewed by me and considered in my medical decision making (see chart for details).     No fracture identified.  Conservative management discussed Final Clinical Impressions(s) / UC Diagnoses   Final diagnoses:  Hand pain, right  Fall, initial encounter     Discharge Instructions      Continue ice and elevation Take hydrocodone as needed for severe pain.  Do not take hydrocodone and drive Wear the wrap and sling for the next couple of days until your pain improves. If not improving over the next week you need to call your primary care doctor to see about a hand specialist, or call here and we will assist you   ED Prescriptions     Medication Sig Dispense Auth. Provider   HYDROcodone-acetaminophen (NORCO/VICODIN) 5-325 MG tablet Take 1-2 tablets by mouth every 6 (six) hours as needed. 15 tablet Eustace Moore, MD      I have reviewed the PDMP during this encounter.   Eustace Moore, MD 08/26/21 [phone]

## 2021-08-26 NOTE — Discharge Instructions (Addendum)
Continue ice and elevation Take hydrocodone as needed for severe pain.  Do not take hydrocodone and drive Wear the wrap and sling for the next couple of days until your pain improves. If not improving over the next week you need to call your primary care doctor to see about a hand specialist, or call here and we will assist you

## 2021-08-26 NOTE — ED Triage Notes (Signed)
Yesterday caught herself from falling  Right wrist  Used ice, heat, and used Tens Unit  Tylenol yesterday

## 2021-08-30 ENCOUNTER — Other Ambulatory Visit: Payer: Self-pay

## 2021-08-30 DIAGNOSIS — Z7901 Long term (current) use of anticoagulants: Secondary | ICD-10-CM

## 2021-08-30 DIAGNOSIS — I872 Venous insufficiency (chronic) (peripheral): Secondary | ICD-10-CM

## 2021-08-31 ENCOUNTER — Telehealth: Payer: Self-pay | Admitting: *Deleted

## 2021-08-31 NOTE — Telephone Encounter (Signed)
Per referral  Dr. Larinda Buttery - called and lvm of upcoming appointments - requested callback to confirm - mailed welcome packet with calendar

## 2021-10-09 NOTE — Progress Notes (Signed)
[facility] New Patient Evaluation and Consultation  Referring Provider: Conan Bowens, MD PCP: Christen Butter, NP Date of Service: 10/10/2021  SUBJECTIVE Chief Complaint: New Patient (Initial Visit)- prolapse  History of Present Illness: Natalie Carney is a 65 y.o. Black or African-American female seen in consultation at the request of Dr. Earlene Plater for evaluation of prolapse.    Review of records from Dr Earlene Plater significant for: Has some prolapse- has to push back inside occasionally. S/p hysterectomy  Urinary Symptoms: Leaks urine with with movement to the bathroom- has to get to the bathroom quickly, leaks right when she gets to the bathroom.  Leaks once a day Pad use: 1 liners/ mini-pads per day.   She is bothered by her UI symptoms. Takes Myrbetriq 25mg - has been helping with the urgency  Day time voids 3-4.  Nocturia: 1-2 times per night to void. Voiding dysfunction: she empties her bladder well.  does not use a catheter to empty bladder.  When urinating, she feels to push on her belly or vagina to empty bladder   UTIs:  0  UTI's in the last year.   Denies history of blood in urine and kidney or bladder stones  Pelvic Organ Prolapse Symptoms:                  She Admits to a feeling of a bulge the vaginal area. Present since her hysterectomy.  She Admits to seeing a bulge.  This bulge is bothersome.  Bowel Symptom: Bowel movements: 1-2 time(s) per day Stool consistency: soft  or loose Straining: yes.  Splinting: no.  Incomplete evacuation: no.  She Denies accidental bowel leakage / fecal incontinence Bowel regimen: fiber  Sexual Function Sexually active: no.   Pain with sex: has discomfort due to prolapse  Pelvic Pain Denies pelvic pain Pain on right side of abdomen- moving upwards. Tried physical therapy for her back but did not improve symptoms.  Happens with bending over, sharp pain. Started happening after hysterectomy.    Past Medical History:   Past Medical History:  Diagnosis Date   Abnormal mammogram    Colon polyp    DVT (deep venous thrombosis) ([facility])    Hypertension    Pulmonary embolism ([facility])      Past Surgical History:   Past Surgical History:  Procedure Laterality Date   ABDOMINAL HYSTERECTOMY     KNEE SURGERY Right    LAPAROSCOPIC GASTRIC BANDING     MYOMECTOMY     SHOULDER SURGERY Left      Past OB/GYN History: OB History  Gravida Para Term Preterm AB Living  1 1       1   SAB IAB Ectopic Multiple Live Births          1    # Outcome Date GA Lbr Len/2nd Weight Sex Delivery Anes PTL Lv  1 Para      Vag-Spont      Hysterectomy 2005 for fibroids, previously had myomectomy  Medications: She has a current medication list which includes the following prescription(s): apixaban, d3 2000, cholecalciferol, esomeprazole, hydrocodone-acetaminophen, irbesartan, magnesium gluconate, mirabegron er, multivitamin-lutein, ondansetron, and ozempic (0.25 or 0.5 mg/dose).   Allergies: Patient is allergic to nsaids.   Social History:  Social History   Tobacco Use   Smoking status: Former    Types: Cigarettes    Quit date: 12/25/1987    Years since quitting: 33.8   Smokeless tobacco: Never  Vaping Use   Vaping Use: Never used  Substance  Use Topics   Alcohol use: Never   Drug use: Never   Family History:   Family History  Problem Relation Age of Onset   Diabetes Father    Hypertension Father    Heart attack Father    Breast cancer Maternal Aunt    Cervical cancer Maternal Grandmother    Breast cancer Other    Ovarian cancer Other      Review of Systems: Review of Systems  Constitutional:  Negative for fever, malaise/fatigue and weight loss.  Respiratory:  Negative for cough, shortness of breath and wheezing.   Cardiovascular:  Positive for leg swelling. Negative for chest pain and palpitations.  Gastrointestinal:  Positive for abdominal pain. Negative for blood in stool.  Genitourinary:  Negative for  dysuria.  Musculoskeletal:  Negative for myalgias.  Skin:  Negative for rash.  Neurological:  Negative for dizziness and headaches.  Endo/Heme/Allergies:  Does not bruise/bleed easily.       + history blood clots  Psychiatric/Behavioral:  Negative for depression. The patient is not nervous/anxious.     OBJECTIVE Physical Exam: Vitals:   10/10/21 1136  BP: (!) 153/79  Pulse: (!) 53  Weight: 270 lb (122.5 kg)    Physical Exam Constitutional:      General: She is not in acute distress. Pulmonary:     Effort: Pulmonary effort is normal.  Abdominal:     General: There is no distension.     Palpations: Abdomen is soft.     Tenderness: There is no abdominal tenderness. There is no rebound.     Comments: + pfannenstiel incision  Musculoskeletal:        General: No swelling. Normal range of motion.  Skin:    General: Skin is warm and dry.     Findings: No rash.  Neurological:     Mental Status: She is alert and oriented to person, place, and time.  Psychiatric:        Mood and Affect: Mood normal.        Behavior: Behavior normal.     GU / Detailed Urogynecologic Evaluation:  Pelvic Exam: Normal external female genitalia; Bartholin's and Skene's glands normal in appearance; urethral meatus normal in appearance, no urethral masses or discharge.   CST: negative  s/p hysterectomy: Speculum exam reveals normal vaginal mucosa without  atrophy and normal vaginal cuff.  Adnexa no mass, fullness, tenderness.     Pelvic floor strength I/V  Pelvic floor musculature: Right levator non-tender, Right obturator tender, Left levator tender, Left obturator tender  POP-Q:   POP-Q  -2                                            Aa   -2                                           Ba  -8                                              C   3.5  Gh  4                                            Pb  8.5                                             tvl   0                                            Ap  0                                            Bp                                                 D     Rectal Exam:  Normal external rectum  Post-Void Residual (PVR) by Bladder Scan: In order to evaluate bladder emptying, we discussed obtaining a postvoid residual and she agreed to this procedure.  Procedure: The ultrasound unit was placed on the patient's abdomen in the suprapubic region after the patient had voided. A PVR of 5 ml was obtained by bladder scan.  Laboratory Results: POC urine: small blood   ASSESSMENT AND PLAN [name] is a 65 y.o. with:  1. Prolapse of posterior vaginal wall   2. Hematuria, unspecified type   3. Urinary frequency   4. Urge incontinence    Stage I anterior, Stage II posterior, Stage I apical prolapse For treatment of pelvic organ prolapse, we discussed options for management including expectant management, conservative management, and surgical management, such as Kegels, a pessary, pelvic floor physical therapy, and specific surgical procedures. - She is not interested in surgery. Would like to return for a pessary fitting.   2. OAB - currently on Myrbetriq 25mg . We discussed increasing to 50mg  to better help symptoms. Given samples to see if this would help.   3. Hematuria - small blood on POC urine. Will send for micro UA and culture to r/o hematuria.   , MD   Medical Decision Making:  - Reviewed/ ordered a clinical laboratory test - Review and summation of prior records

## 2021-10-10 ENCOUNTER — Ambulatory Visit (INDEPENDENT_AMBULATORY_CARE_PROVIDER_SITE_OTHER): Payer: No Typology Code available for payment source | Admitting: Obstetrics and Gynecology

## 2021-10-10 ENCOUNTER — Other Ambulatory Visit: Payer: Self-pay | Admitting: Family

## 2021-10-10 ENCOUNTER — Other Ambulatory Visit: Payer: Self-pay

## 2021-10-10 ENCOUNTER — Encounter: Payer: Self-pay | Admitting: Obstetrics and Gynecology

## 2021-10-10 VITALS — BP 153/79 | HR 53 | Wt 270.0 lb

## 2021-10-10 DIAGNOSIS — R350 Frequency of micturition: Secondary | ICD-10-CM | POA: Diagnosis not present

## 2021-10-10 DIAGNOSIS — R319 Hematuria, unspecified: Secondary | ICD-10-CM | POA: Diagnosis not present

## 2021-10-10 DIAGNOSIS — I824Y9 Acute embolism and thrombosis of unspecified deep veins of unspecified proximal lower extremity: Secondary | ICD-10-CM

## 2021-10-10 DIAGNOSIS — I2699 Other pulmonary embolism without acute cor pulmonale: Secondary | ICD-10-CM

## 2021-10-10 DIAGNOSIS — N816 Rectocele: Secondary | ICD-10-CM

## 2021-10-10 DIAGNOSIS — N3941 Urge incontinence: Secondary | ICD-10-CM

## 2021-10-10 DIAGNOSIS — M62838 Other muscle spasm: Secondary | ICD-10-CM

## 2021-10-10 LAB — POCT URINALYSIS DIPSTICK
Appearance: NORMAL
Bilirubin, UA: NEGATIVE
Glucose, UA: NEGATIVE
Ketones, UA: NEGATIVE
Leukocytes, UA: NEGATIVE
Nitrite, UA: NEGATIVE
Protein, UA: NEGATIVE
Spec Grav, UA: >=1.03 — AB (ref 1.010–1.025)
Urobilinogen, UA: 0.2 E.U./dL
pH, UA: 5.5 (ref 5.0–8.0)

## 2021-10-11 ENCOUNTER — Inpatient Hospital Stay: Payer: BLUE CROSS/BLUE SHIELD | Attending: Hematology & Oncology

## 2021-10-11 ENCOUNTER — Telehealth: Payer: Self-pay | Admitting: *Deleted

## 2021-10-11 ENCOUNTER — Ambulatory Visit (INDEPENDENT_AMBULATORY_CARE_PROVIDER_SITE_OTHER): Payer: No Typology Code available for payment source | Admitting: Medical-Surgical

## 2021-10-11 ENCOUNTER — Inpatient Hospital Stay: Payer: BLUE CROSS/BLUE SHIELD | Admitting: Family

## 2021-10-11 ENCOUNTER — Encounter: Payer: Self-pay | Admitting: Family

## 2021-10-11 ENCOUNTER — Encounter: Payer: Self-pay | Admitting: Medical-Surgical

## 2021-10-11 ENCOUNTER — Ambulatory Visit (INDEPENDENT_AMBULATORY_CARE_PROVIDER_SITE_OTHER): Payer: No Typology Code available for payment source

## 2021-10-11 VITALS — BP 156/57 | HR 59 | Temp 98.9°F | Resp 17 | Ht 68.0 in | Wt 267.0 lb

## 2021-10-11 VITALS — BP 128/78 | HR 72 | Resp 20 | Ht 68.0 in | Wt 267.0 lb

## 2021-10-11 DIAGNOSIS — E118 Type 2 diabetes mellitus with unspecified complications: Secondary | ICD-10-CM | POA: Diagnosis not present

## 2021-10-11 DIAGNOSIS — Z79899 Other long term (current) drug therapy: Secondary | ICD-10-CM | POA: Insufficient documentation

## 2021-10-11 DIAGNOSIS — G8929 Other chronic pain: Secondary | ICD-10-CM | POA: Diagnosis not present

## 2021-10-11 DIAGNOSIS — Z803 Family history of malignant neoplasm of breast: Secondary | ICD-10-CM | POA: Diagnosis not present

## 2021-10-11 DIAGNOSIS — E119 Type 2 diabetes mellitus without complications: Secondary | ICD-10-CM | POA: Diagnosis not present

## 2021-10-11 DIAGNOSIS — Z87891 Personal history of nicotine dependence: Secondary | ICD-10-CM | POA: Insufficient documentation

## 2021-10-11 DIAGNOSIS — M25562 Pain in left knee: Secondary | ICD-10-CM

## 2021-10-11 DIAGNOSIS — I824Y9 Acute embolism and thrombosis of unspecified deep veins of unspecified proximal lower extremity: Secondary | ICD-10-CM | POA: Diagnosis not present

## 2021-10-11 DIAGNOSIS — Z7901 Long term (current) use of anticoagulants: Secondary | ICD-10-CM | POA: Diagnosis not present

## 2021-10-11 DIAGNOSIS — R21 Rash and other nonspecific skin eruption: Secondary | ICD-10-CM | POA: Diagnosis not present

## 2021-10-11 DIAGNOSIS — Z86711 Personal history of pulmonary embolism: Secondary | ICD-10-CM | POA: Diagnosis not present

## 2021-10-11 DIAGNOSIS — Z86010 Personal history of colonic polyps: Secondary | ICD-10-CM | POA: Diagnosis not present

## 2021-10-11 DIAGNOSIS — I2699 Other pulmonary embolism without acute cor pulmonale: Secondary | ICD-10-CM

## 2021-10-11 DIAGNOSIS — Z86718 Personal history of other venous thrombosis and embolism: Secondary | ICD-10-CM | POA: Insufficient documentation

## 2021-10-11 DIAGNOSIS — Z23 Encounter for immunization: Secondary | ICD-10-CM | POA: Diagnosis not present

## 2021-10-11 DIAGNOSIS — I10 Essential (primary) hypertension: Secondary | ICD-10-CM | POA: Diagnosis not present

## 2021-10-11 LAB — POCT GLYCOSYLATED HEMOGLOBIN (HGB A1C): HbA1c, POC (controlled diabetic range): 5.7 % (ref 0.0–7.0)

## 2021-10-11 LAB — URINALYSIS, MICROSCOPIC ONLY
Bacteria, UA: NONE SEEN
Casts: NONE SEEN /lpf

## 2021-10-11 LAB — CBC WITH DIFFERENTIAL (CANCER CENTER ONLY)
Abs Immature Granulocytes: 0.03 10*3/uL (ref 0.00–0.07)
Basophils Absolute: 0 10*3/uL (ref 0.0–0.1)
Basophils Relative: 0 %
Eosinophils Absolute: 0.2 10*3/uL (ref 0.0–0.5)
Eosinophils Relative: 3 %
HCT: 37 % (ref 36.0–46.0)
Hemoglobin: 11.5 g/dL — ABNORMAL LOW (ref 12.0–15.0)
Immature Granulocytes: 0 %
Lymphocytes Relative: 38 %
Lymphs Abs: 3 10*3/uL (ref 0.7–4.0)
MCH: 24.6 pg — ABNORMAL LOW (ref 26.0–34.0)
MCHC: 31.1 g/dL (ref 30.0–36.0)
MCV: 79.2 fL — ABNORMAL LOW (ref 80.0–100.0)
Monocytes Absolute: 0.4 10*3/uL (ref 0.1–1.0)
Monocytes Relative: 5 %
Neutro Abs: 4.2 10*3/uL (ref 1.7–7.7)
Neutrophils Relative %: 54 %
Platelet Count: 279 10*3/uL (ref 150–400)
RBC: 4.67 MIL/uL (ref 3.87–5.11)
RDW: 15.1 % (ref 11.5–15.5)
WBC Count: 7.9 10*3/uL (ref 4.0–10.5)
nRBC: 0 % (ref 0.0–0.2)

## 2021-10-11 LAB — CMP (CANCER CENTER ONLY)
ALT: 15 U/L (ref 0–44)
AST: 12 U/L — ABNORMAL LOW (ref 15–41)
Albumin: 3.9 g/dL (ref 3.5–5.0)
Alkaline Phosphatase: 84 U/L (ref 38–126)
Anion gap: 6 (ref 5–15)
BUN: 12 mg/dL (ref 8–23)
CO2: 29 mmol/L (ref 22–32)
Calcium: 9.8 mg/dL (ref 8.9–10.3)
Chloride: 103 mmol/L (ref 98–111)
Creatinine: 0.81 mg/dL (ref 0.44–1.00)
GFR, Estimated: >60 mL/min (ref >60)
Glucose, Bld: 120 mg/dL — ABNORMAL HIGH (ref 70–99)
Potassium: 3.8 mmol/L (ref 3.5–5.1)
Sodium: 138 mmol/L (ref 135–145)
Total Bilirubin: 0.6 mg/dL (ref 0.3–1.2)
Total Protein: 7.4 g/dL (ref 6.5–8.1)

## 2021-10-11 LAB — ANTITHROMBIN III: AntiThromb III Func: 108 % (ref 75–120)

## 2021-10-11 LAB — LACTATE DEHYDROGENASE: LDH: 156 U/L (ref 98–192)

## 2021-10-11 MED ORDER — SEMAGLUTIDE (1 MG/DOSE) 4 MG/3ML ~~LOC~~ SOPN: 1.0000 mg | PEN_INJECTOR | SUBCUTANEOUS | 3 refills | Status: DC

## 2021-10-11 MED ORDER — IRBESARTAN 300 MG PO TABS: 300.0000 mg | ORAL_TABLET | Freq: Every day | ORAL | 1 refills | Status: DC

## 2021-10-11 NOTE — Progress Notes (Signed)
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  She states that her first PE (left) and DVT (bilateral) occurred in 1986 while she was on oral contraception and smoking. She quit both smoking and taking birth control at that times.  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  She did develop a DVT after having a hysterectomy. She had two left shoulder surgeries and arthroscopic surgery on her right knee without any complications.  She notes that she has recurred while on Coumadin and Xarelto. She was on Lovenox injections BID for a while but stopped as the two shots a day were very stressful for her. She has been on Eliquis 5 mg PO BID since November 2021 and so far she has had no new recurrence.   She can not remember every having coagulation studies done.  She has one daughter and no history of miscarriage.  No personal history of cancer.  Her daughter has history of kidney cancer and also more recent history of bilateral pulmonary emboli and bilateral DVT's.  Maternal aunt and maternal cousin have history of breast cancer and maternal grandmother had cervical cancer.  She states that she is up to date on her annual mammogram and result was negative. She states that she had her colonoscopy last year and states that she had several benign polyps removed at that time.  No history of thyroid disease.  She states that her diabetes is well controlled. She just recently started Ozempic.  No numbness or tingling in her extremities.   She has mild swelling in her lower extremities that comes and goes. She wears her compression stockings regularly for added support.  No falls or syncope to report.  No fever, chills, n/v, cough, dizziness, SOB, chest pain, palpitations, abdominal pain or changes in bowel or bladder habits.  She has a rash on the right le that comes and goes. She uses Diprolene cream prescribed by her dermatologist in [place].  She has maintained a good appetite and is staying well hydrated. Her weight is described as stable.  No ETOH or recreational drug use.  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  She is originally from [place], [place] and moved to [place] last fall.   ROS: All other 10 point review of systems is negative.   PAST MEDICAL HISTORY:   Past Medical History:  Diagnosis Date   Abnormal mammogram    Colon polyp    DVT (deep venous thrombosis) (HCC)    Hypertension    Pulmonary embolism (HCC)     ALLERGIES: Allergies  Allergen Reactions   Nsaids     On Blood Thinners      MEDICATIONS:  Current Outpatient Medications on File Prior to Visit  Medication Sig Dispense Refill   apixaban (ELIQUIS) 5 MG TABS tablet Take 5 mg by mouth 2 (two) times daily.     betamethasone dipropionate (DIPROLENE) 0.05 % ointment Apply topically. As needed     Cholecalciferol (D3 2000) 50 MCG (2000 UT) CAPS Take by  mouth.     Cholecalciferol 25 MCG (1000 UT) tablet Take 1 tablet by mouth daily.     esomeprazole (NEXIUM) 40 MG capsule Take 40 mg by mouth daily at 12 noon.     HYDROcodone-acetaminophen (NORCO/VICODIN) 5-325 MG tablet Take 1-2 tablets by mouth every 6 (six) hours as needed. 15 tablet 0   irbesartan (AVAPRO) 300 MG tablet Take 300 mg by mouth daily.     magnesium gluconate (MAGONATE) 500 MG tablet Take 500 mg by mouth daily.     mirabegron ER (MYRBETRIQ) 25 MG TB24 tablet Take 1 tablet (25 mg total) by mouth daily. 90 tablet 3   multivitamin-lutein (OCUVITE-LUTEIN)  CAPS capsule Take 1 capsule by mouth daily.     ondansetron (ZOFRAN-ODT) 8 MG disintegrating tablet Take 1 tablet (8 mg total) by mouth every 8 (eight) hours as needed for nausea. 20 tablet 3   Semaglutide,0.25 or 0.5MG /DOS, (OZEMPIC, 0.25 OR 0.5 MG/DOSE,) 2 MG/1.5ML SOPN Inject into the skin.     tiZANidine (ZANAFLEX) 4 MG capsule Take 4 mg by mouth 3 (three) times daily as needed for muscle spasms.     No current facility-administered medications on file prior to visit.     PAST SURGICAL HISTORY Past Surgical History:  Procedure Laterality Date   ABDOMINAL HYSTERECTOMY     KNEE SURGERY Right    LAPAROSCOPIC GASTRIC BANDING     MYOMECTOMY     SHOULDER SURGERY Left     FAMILY HISTORY: Family History  Problem Relation Age of Onset   Diabetes Father    Hypertension Father    Heart attack Father    Breast cancer Maternal Aunt    Cervical cancer Maternal Grandmother    Breast cancer Other    Ovarian cancer Other     SOCIAL HISTORY:  reports that she quit smoking about 33 years ago. Her smoking use included cigarettes. She has never used smokeless tobacco. She reports that she does not drink alcohol and does not use drugs.  PERFORMANCE STATUS: The patient's performance status is 1 - Symptomatic but completely ambulatory  PHYSICAL EXAM: Most Recent Vital Signs: Blood pressure (!) 156/57, pulse (!) 59, temperature 98.9 F (37.2 C), temperature source Oral, resp. rate 17, height 5\' 8"  (1.727 m), weight 267 lb (121.1 kg), SpO2 99 %. BP (!) 156/57 (BP Location: Left Arm, Patient Position: Sitting)   Pulse (!) 59   Temp 98.9 F (37.2 C) (Oral)   Resp 17   Ht 5\' 8"  (1.727 m)   Wt 267 lb (121.1 kg)   SpO2 99%   BMI 40.60 kg/m   General Appearance:    Alert, cooperative, no distress, appears stated age  Head:    Normocephalic, without obvious abnormality, atraumatic  Eyes:    PERRL, conjunctiva/corneas clear, EOM's intact, fundi    benign, both eyes        Throat:   Lips,  mucosa, and tongue normal; teeth and gums normal  Neck:   Supple, symmetrical, trachea midline, no adenopathy;    thyroid:  no enlargement/tenderness/nodules; no carotid   bruit or JVD  Back:     Symmetric, no curvature, ROM normal, no CVA tenderness  Lungs:     Clear to auscultation bilaterally, respirations unlabored  Chest Wall:    No tenderness or deformity   Heart:    Regular rate and rhythm, S1 and S2 normal, no murmur, rub   or gallop     Abdomen:     Soft, non-tender, bowel sounds  active all four quadrants,    no masses, no organomegaly        Extremities:   Extremities normal, atraumatic, no cyanosis or edema  Pulses:   2+ and symmetric all extremities  Skin:   Skin color, texture, turgor normal, no rashes or lesions  Lymph nodes:   Cervical, supraclavicular, and axillary nodes normal  Neurologic:   CNII-XII intact, normal strength, sensation and reflexes    throughout    LABORATORY DATA:  Results for orders placed or performed in visit on 10/11/21 (from the past 48 hour(s))  CBC with Differential (Cancer Center Only)     Status: Abnormal   Collection Time: 10/11/21  9:12 AM  Result Value Ref Range   WBC Count 7.9 4.0 - 10.5 K/uL   RBC 4.67 3.87 - 5.11 MIL/uL   Hemoglobin 11.5 (L) 12.0 - 15.0 g/dL   HCT 83.1 51.7 - 61.6 %   MCV 79.2 (L) 80.0 - 100.0 fL   MCH 24.6 (L) 26.0 - 34.0 pg   MCHC 31.1 30.0 - 36.0 g/dL   RDW 07.3 71.0 - 62.6 %   Platelet Count 279 150 - 400 K/uL   nRBC 0.0 0.0 - 0.2 %   Neutrophils Relative % 54 %   Neutro Abs 4.2 1.7 - 7.7 K/uL   Lymphocytes Relative 38 %   Lymphs Abs 3.0 0.7 - 4.0 K/uL   Monocytes Relative 5 %   Monocytes Absolute 0.4 0.1 - 1.0 K/uL   Eosinophils Relative 3 %   Eosinophils Absolute 0.2 0.0 - 0.5 K/uL   Basophils Relative 0 %   Basophils Absolute 0.0 0.0 - 0.1 K/uL   Immature Granulocytes 0 %   Abs Immature Granulocytes 0.03 0.00 - 0.07 K/uL    Comment: Performed at [facility], [address], [place], [place] [address]  CMP (Cancer Center only)     Status: Abnormal   Collection Time: 10/11/21  9:12 AM  Result Value Ref Range   Sodium 138 135 - 145 mmol/L   Potassium 3.8 3.5 - 5.1 mmol/L   Chloride 103 98 - 111 mmol/L   CO2 29 22 - 32 mmol/L   Glucose, Bld 120 (H) 70 - 99 mg/dL    Comment: Glucose reference range applies only to samples taken after fasting for at least 8 hours.   BUN 12 8 - 23 mg/dL   Creatinine 6.27 0.35 - 1.00 mg/dL   Calcium 9.8 8.9 - 00.9 mg/dL   Total Protein 7.4 6.5 - 8.1 g/dL   Albumin 3.9 3.5 - 5.0 g/dL   AST 12 (L) 15 - 41 U/L   ALT 15 0 - 44 U/L   Alkaline Phosphatase 84 38 - 126 U/L   Total Bilirubin 0.6 0.3 - 1.2 mg/dL   GFR, Estimated >38 >18 mL/min    Comment: (NOTE) Calculated using the CKD-EPI Creatinine Equation (2021)    Anion gap 6 5 - 15    Comment: Performed at [facility], [address] [place], [place] [address]      RADIOGRAPHY: No results found.     PATHOLOGY: None  ASSESSMENT/PLAN: Ms. Pak is a very pleasant 65 yo [place] American female with history of multiple thrombotic events.  She has failed Coumadin and Xarelto due to recurrence.  So far, she seems to be doing well on Eliquis 5 mg PO BID.  Hyper coag panel pending.  We will get a repeat US of both legs to assess for any changes. She has what sounds like some intermittent post phlebitic pain and swelling.  Follow-up in 3 months.   All questions were answered. The patient knows to call the clinic with any problems, questions or concerns. We can certainly see the patient much sooner if necessary.  The patient was discussed with Dr. Myna Hidalgo and he is in agreement with the aforementioned.   Eileen Stanford, NP

## 2021-10-11 NOTE — Telephone Encounter (Signed)
Per 10/11/21 los - called and lvm of upcoming appointments - requested call back to confirm.

## 2021-10-11 NOTE — Progress Notes (Signed)
  HPI with pertinent ROS:   CC: Diabetes follow-up  HPI: Very pleasant 65 year old female presenting today for diabetes follow-up.  She has been doing very well since her last visit.  Taking semaglutide 1 mg weekly, tolerating well without side effects.  Checking sugars periodically with most being around the 110 area.  She has never had 1/120.  Her lowest reading has been 86.  Notes that she gets headaches if her sugar is high or low.  Chronic left knee pain that has been managed over the years with various injections and treatments.  Unfortunately, her left knee pain has flared and she is to the point that she is considering surgical options.  She would like to have a referral to Ortho today.  Has not had any recent imaging of the left knee.  I reviewed the past medical history, family history, social history, surgical history, and allergies today and no changes were needed.  Please see the problem list section below in epic for further details.   Physical exam:   General: Well Developed, well nourished, and in no acute distress.  Neuro: Alert and oriented x3.  HEENT: Normocephalic, atraumatic.  Skin: Warm and dry. Cardiac: Regular rate and rhythm, no murmurs rubs or gallops, no lower extremity edema.  Respiratory: Clear to auscultation bilaterally. Not using accessory muscles, speaking in full sentences.  Impression and Recommendations:    1. Controlled type 2 diabetes mellitus with complication, without long-term current use of insulin (HCC) POCT hemoglobin A1c 5.7% today.  Very well controlled so continue Ozempic 1 mg subcu weekly.  Continue checking sugars as needed.  Foot exam completed today. - POCT glycosylated hemoglobin (Hb A1C)  2. Chronic pain of left knee Since she has already gone the route of injections and would prefer to just discuss surgical options, referring to Ortho today.  Getting left knee x-rays.  3. Flu vaccine need Flu vaccine given in office today. - Flu  Vaccine QUAD 78mo+IM (Fluarix, Fluzone & Alfiuria Quad PF)  Return in about 6 months (around 04/11/2022) for DM/HTN/HLD follow up. ___________________________________________ Thayer Ohm, DNP, APRN, FNP-BC Primary Care and Sports Medicine [facility]

## 2021-10-12 ENCOUNTER — Other Ambulatory Visit: Payer: Self-pay

## 2021-10-12 ENCOUNTER — Ambulatory Visit (HOSPITAL_BASED_OUTPATIENT_CLINIC_OR_DEPARTMENT_OTHER)
Admission: RE | Admit: 2021-10-12 | Discharge: 2021-10-12 | Disposition: A | Discharge status: Home / Self Care | Payer: BLUE CROSS/BLUE SHIELD | Source: Ambulatory Visit | Attending: Family | Admitting: Family

## 2021-10-12 DIAGNOSIS — I824Y9 Acute embolism and thrombosis of unspecified deep veins of unspecified proximal lower extremity: Secondary | ICD-10-CM | POA: Insufficient documentation

## 2021-10-12 LAB — LUPUS ANTICOAGULANT PANEL
DRVVT: 69.5 s — ABNORMAL HIGH (ref 0.0–47.0)
PTT Lupus Anticoagulant: 41.2 s (ref 0.0–51.9)

## 2021-10-12 LAB — BETA-2-GLYCOPROTEIN I ABS, IGG/M/A
Beta-2 Glyco I IgG: <9 GPI IgG units (ref 0–20)
Beta-2-Glycoprotein I IgA: <9 GPI IgA units (ref 0–25)
Beta-2-Glycoprotein I IgM: 18 GPI IgM units (ref 0–32)

## 2021-10-12 LAB — PROTEIN S, TOTAL: Protein S Ag, Total: 106 % (ref 60–150)

## 2021-10-12 LAB — HOMOCYSTEINE: Homocysteine: 12.3 umol/L (ref 0.0–17.2)

## 2021-10-12 LAB — PROTEIN S ACTIVITY: Protein S Activity: 82 % (ref 63–140)

## 2021-10-12 LAB — DRVVT MIX: dRVVT Mix: 52.7 s — ABNORMAL HIGH (ref 0.0–40.4)

## 2021-10-12 LAB — DRVVT CONFIRM: dRVVT Confirm: 1.2 ratio (ref 0.8–1.2)

## 2021-10-12 LAB — PROTEIN C ACTIVITY: Protein C Activity: 77 % (ref 73–180)

## 2021-10-13 ENCOUNTER — Telehealth: Payer: Self-pay | Admitting: Family

## 2021-10-13 LAB — CARDIOLIPIN ANTIBODIES, IGG, IGM, IGA
Anticardiolipin IgA: <9 APL U/mL (ref 0–11)
Anticardiolipin IgG: <9 GPL U/mL (ref 0–14)
Anticardiolipin IgM: 19 MPL U/mL — ABNORMAL HIGH (ref 0–12)

## 2021-10-13 LAB — PROTEIN C, TOTAL: Protein C, Total: 72 % (ref 60–150)

## 2021-10-13 LAB — URINE CULTURE

## 2021-10-13 NOTE — Telephone Encounter (Signed)
I was able to speak with Natalie Carney and go over her bilateral lower extremity US results. No questions at this time. Patient appreciative of call.

## 2021-10-16 LAB — PROTHROMBIN GENE MUTATION

## 2021-10-16 LAB — FACTOR 5 LEIDEN

## 2021-10-16 NOTE — Addendum Note (Signed)
Addended by: Marguerita Beards on: 10/16/2021 01:06 PM   Modules accepted: Orders

## 2021-10-16 NOTE — Progress Notes (Signed)
Pt covered at 100%. Pt does have a $25 co-pay

## 2021-10-18 ENCOUNTER — Telehealth: Payer: Self-pay | Admitting: Family

## 2021-10-18 NOTE — Progress Notes (Signed)
CYSTOSCOPY  CC:  This is a 65 y.o. with microscopic hematuria who presents today for cystoscopy.  POC urine: trace leukocytes, negative nitrites  BP (!) 146/75   Pulse 60   Wt 267 lb (121.1 kg)   BMI 40.60 kg/m   CYSTOSCOPY: A time out was performed.  The periurethral area was prepped and draped in a sterile manner.  2% lidocaine jetpack was inserted at the urethral meatus and the urethra and bladder visualized with 0- and 70-degree scopes.  She had normal urethral coaptation and normal urethral mucosa.  She had normal bladder mucosa. She had bilateral clear efflux from both ureteral orifices.  She had no squamous metaplasia at the trigone, no trabeculations, cellules or diverticuli.     ASSESSMENT:  65 y.o. with microscopic hematuria. Cystoscopy today is normal.  PLAN:  She will get CT scan to assess upper GU tract.  All questions answered and post-procedures instructions were given.   Next appointment scheduled for pessary fitting.   Marguerita Beards, MD

## 2021-10-18 NOTE — Telephone Encounter (Signed)
I spoke with Natalie Carney and went over her hyper coag work up. This was negative overall. She did have a slightly elevated anticardiolipin IgM level that was considered to be indeterminate. No changes in Eliquis regimen at this time. Patient has no questions or concerns at this time and was appreciative of call.

## 2021-10-19 ENCOUNTER — Other Ambulatory Visit: Payer: Self-pay

## 2021-10-19 ENCOUNTER — Encounter: Payer: Self-pay | Admitting: Obstetrics and Gynecology

## 2021-10-19 ENCOUNTER — Ambulatory Visit: Payer: No Typology Code available for payment source | Admitting: Obstetrics and Gynecology

## 2021-10-19 VITALS — BP 146/75 | HR 60 | Wt 267.0 lb

## 2021-10-19 DIAGNOSIS — R319 Hematuria, unspecified: Secondary | ICD-10-CM | POA: Diagnosis not present

## 2021-10-19 NOTE — Patient Instructions (Signed)

## 2021-10-24 ENCOUNTER — Ambulatory Visit: Payer: No Typology Code available for payment source | Admitting: Orthopaedic Surgery

## 2021-10-26 ENCOUNTER — Ambulatory Visit (INDEPENDENT_AMBULATORY_CARE_PROVIDER_SITE_OTHER): Payer: No Typology Code available for payment source

## 2021-10-26 ENCOUNTER — Ambulatory Visit: Payer: No Typology Code available for payment source | Admitting: Orthopaedic Surgery

## 2021-10-26 ENCOUNTER — Other Ambulatory Visit: Payer: Self-pay

## 2021-10-26 ENCOUNTER — Encounter: Payer: Self-pay | Admitting: Orthopaedic Surgery

## 2021-10-26 VITALS — Ht 68.0 in | Wt 268.0 lb

## 2021-10-26 DIAGNOSIS — Z6841 Body Mass Index (BMI) 40.0 and over, adult: Secondary | ICD-10-CM

## 2021-10-26 DIAGNOSIS — R319 Hematuria, unspecified: Secondary | ICD-10-CM

## 2021-10-26 DIAGNOSIS — M1712 Unilateral primary osteoarthritis, left knee: Secondary | ICD-10-CM | POA: Diagnosis not present

## 2021-10-26 MED ORDER — IOHEXOL 300 MG/ML  SOLN
200.0000 mL | Freq: Once | INTRAMUSCULAR | Status: AC | PRN
  Administered 2021-10-26: 125 mL via INTRAVENOUS

## 2021-10-26 NOTE — Progress Notes (Signed)
Office Visit Note   Patient: Natalie Carney           Date of Birth: 1955/12/30           MRN: [id number] Visit Date: 10/26/2021              Requested by: Samuel Bouche, [address] [place],  [place] [address] PCP: Samuel Bouche, NP   Assessment & Plan: Visit Diagnoses:  1. Unilateral primary osteoarthritis, left knee     Plan: Impression is left knee degenerative joint disease.  Today, we discussed cortisone injection versus repeat viscosupplementation injection versus total knee arthroplasty.  She notes that she is not interested in any more injections and would like to proceed with definitive treatment.  She currently has a weight of 268 pounds and a BMI of 40.75.  We discussed that she needs to get down to 260 pounds in order to obtain a BMI less than 40 to proceed with surgery.  She understands and agrees.  She will follow-up with [place] once she has met this goal for further discussion.  Call with concerns or questions in the meantime.  The patient meets the AMA guidelines for Morbid (severe) obesity with a BMI > 40.0 and I have recommended weight loss.  Follow-Up Instructions: Return if symptoms worsen or fail to improve.   Orders:  No orders of the defined types were placed in this encounter.  No orders of the defined types were placed in this encounter.     Procedures: No procedures performed   Clinical Data: No additional findings.   Subjective: Chief Complaint  Patient presents with   Left Knee - Pain     HPI patient is a very pleasant 65 year old female recent transplant from [place] who is here today with chronic left knee pain.  She has been dealing with this for many years.  She has had viscosupplementation injections in the past which have lasted no longer than a few months.  She continues to have pain to the medial aspect of the knee that is constant but made worse going up and down stairs as well as when she is trying to sleep at night.   She has been taking Tylenol without significant relief.  She does have an underlying history of multiple DVTs as well as a single PE and is currently on Eliquis.  Currently being worked up by hematology for clotting disorders.  She is also diabetic with a recent hemoglobin A1c of 6.4.    Review of Systems as detailed in HPI.  All others reviewed and are negative.   Objective: Vital Signs: Ht $RemoveB'5\' 8"'tnOkjxCH$  (1.727 m)   Wt 268 lb (121.6 kg)   BMI 40.75 kg/m   Physical Exam well-developed well-nourished female no acute distress.  Alert and oriented x3.  Ortho Exam left knee exam shows range of motion 0 to 100 degrees.  Medial and lateral joint line tenderness.  Mild patellofemoral crepitus.  Ligaments are stable.  She is neurovascular intact distally.  Specialty Comments:  No specialty comments available.  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   Venous (peripheral) insufficiency 03/14/2021  Past Medical History:  Diagnosis Date   Abnormal mammogram    Colon polyp    DVT (deep venous thrombosis) (HCC)    Hypertension    Pulmonary embolism ([place])     Family History  Problem Relation Age of Onset   Diabetes Father    Hypertension Father    Heart attack Father    Breast cancer Maternal Aunt    Cervical cancer Maternal Grandmother    Breast cancer Other    Ovarian cancer Other     Past Surgical History:  Procedure Laterality Date   ABDOMINAL HYSTERECTOMY     KNEE SURGERY Right    LAPAROSCOPIC GASTRIC BANDING     MYOMECTOMY     SHOULDER SURGERY Left    Social History   Occupational History    Not on file  Tobacco Use   Smoking status: Former    Types: Cigarettes    Quit date: 12/25/1987    Years since quitting: 33.8   Smokeless tobacco: Never  Vaping Use   Vaping Use: Never used  Substance and Sexual Activity   Alcohol use: Never   Drug use: Never   Sexual activity: Yes    Partners: Male    Birth control/protection: Surgical

## 2021-10-27 ENCOUNTER — Ambulatory Visit: Payer: No Typology Code available for payment source | Admitting: Obstetrics and Gynecology

## 2021-11-14 ENCOUNTER — Ambulatory Visit: Payer: No Typology Code available for payment source | Admitting: Medical-Surgical

## 2021-11-14 ENCOUNTER — Encounter: Payer: Self-pay | Admitting: Medical-Surgical

## 2021-11-14 ENCOUNTER — Other Ambulatory Visit: Payer: Self-pay

## 2021-11-14 VITALS — BP 127/72 | HR 56 | Resp 20 | Ht 68.0 in | Wt 266.0 lb

## 2021-11-14 DIAGNOSIS — R197 Diarrhea, unspecified: Secondary | ICD-10-CM

## 2021-11-14 MED ORDER — METRONIDAZOLE 500 MG PO TABS
500.0000 mg | ORAL_TABLET | Freq: Three times a day (TID) | ORAL | 0 refills | Status: AC
Start: 2021-11-14 — End: 2021-11-21

## 2021-11-14 MED ORDER — CIPROFLOXACIN HCL 500 MG PO TABS: 500.0000 mg | ORAL_TABLET | Freq: Two times a day (BID) | ORAL | 0 refills | Status: DC

## 2021-11-14 NOTE — Progress Notes (Signed)
  HPI with pertinent ROS:   CC: Diarrhea  HPI: Pleasant 65 year old female presenting today with complaints of approximately 1 week of diarrhea.  She has been having very loose watery stools along with some intermittent nausea and a couple of episodes of vomiting.  Has not been around any sick individuals.  She did travel to [place] for a funeral however notes she was sick for she left, during the trip, and after.  Unsure if this is related to stress since they did just moved to the area and they have also had a death in the family.  She also has a history of a Lap-Band but it was removed.  She has had some intermittent epigastric pain but denies fever, chills, cramping, bloating, excessive flatulence, melena, hematochezia, and hematemesis.  Has only been able to eat an egg white and a piece of white toast along with drinking fluids.  Admits that she is actually scared to try eating more than that due to her stomach issues recently.  She has Zofran which does help with nausea and has been taking Imodium and Pepto-Bismol as needed for diarrhea.  Notes that her symptoms are better over the last couple of days.  No recent hospitalizations or antibiotic use in the last 3 months.  I reviewed the past medical history, family history, social history, surgical history, and allergies today and no changes were needed.  Please see the problem list section below in epic for further details.   Physical exam:   General: Well Developed, well nourished, and in no acute distress.  Neuro: Alert and oriented x3.  HEENT: Normocephalic, atraumatic.  Skin: Warm and dry. Cardiac: Regular rate and rhythm, no murmurs rubs or gallops, no lower extremity edema.  Respiratory: Clear to auscultation bilaterally. Not using accessory muscles, speaking in full sentences. Abdomen: Soft, diffuse generalized tenderness, nondistended. Bowel sounds + x 4 quadrants. No HSM appreciated.   Impression and Recommendations:    1.  Diarrhea, unspecified type Unclear etiology.  Consider viral gastroenteritis, food poisoning, traveler's diarrhea, or diverticulitis.  Reassuring that her symptoms have improved a little over the last couple of days but unclear if this is related to over-the-counter treatments or resolution of her current illness.  We will go ahead and treat empirically with Cipro 500 mg twice daily for 5 days and metronidazole 500 mg 3 times daily x7 days to cover for traveler's diarrhea and diverticulitis.  If no resolution of symptoms at that point, we will need to do labs and stool studies for further evaluation.  Discussed resuming dietary intake with the brat diet and slowly progressing as tolerated.  Return if symptoms worsen or fail to improve. ___________________________________________ Thayer Ohm, DNP, APRN, FNP-BC Primary Care and Sports Medicine [facility] [place]

## 2021-12-21 ENCOUNTER — Telehealth: Payer: Self-pay

## 2021-12-21 NOTE — Telephone Encounter (Signed)
Pt requesting refill on Myrbetriq. Advised pt Christen Butter, NP prescribed the medication and she should reach out to her office.

## 2022-01-11 ENCOUNTER — Other Ambulatory Visit: Payer: Self-pay | Admitting: Family

## 2022-01-11 DIAGNOSIS — I824Y9 Acute embolism and thrombosis of unspecified deep veins of unspecified proximal lower extremity: Secondary | ICD-10-CM

## 2022-01-11 DIAGNOSIS — I2699 Other pulmonary embolism without acute cor pulmonale: Secondary | ICD-10-CM

## 2022-01-12 ENCOUNTER — Encounter: Payer: Self-pay | Admitting: Family

## 2022-01-12 ENCOUNTER — Inpatient Hospital Stay: Payer: BLUE CROSS/BLUE SHIELD | Admitting: Family

## 2022-01-12 ENCOUNTER — Other Ambulatory Visit: Payer: Self-pay

## 2022-01-12 ENCOUNTER — Inpatient Hospital Stay: Payer: BLUE CROSS/BLUE SHIELD | Attending: Hematology & Oncology

## 2022-01-12 VITALS — BP 155/59 | HR 75 | Temp 98.1°F | Resp 17 | Wt 260.2 lb

## 2022-01-12 DIAGNOSIS — K5900 Constipation, unspecified: Secondary | ICD-10-CM | POA: Diagnosis not present

## 2022-01-12 DIAGNOSIS — I890 Lymphedema, not elsewhere classified: Secondary | ICD-10-CM | POA: Diagnosis not present

## 2022-01-12 DIAGNOSIS — Z7901 Long term (current) use of anticoagulants: Secondary | ICD-10-CM | POA: Diagnosis not present

## 2022-01-12 DIAGNOSIS — I2699 Other pulmonary embolism without acute cor pulmonale: Secondary | ICD-10-CM

## 2022-01-12 DIAGNOSIS — I824Y9 Acute embolism and thrombosis of unspecified deep veins of unspecified proximal lower extremity: Secondary | ICD-10-CM

## 2022-01-12 DIAGNOSIS — Z86718 Personal history of other venous thrombosis and embolism: Secondary | ICD-10-CM | POA: Insufficient documentation

## 2022-01-12 DIAGNOSIS — I872 Venous insufficiency (chronic) (peripheral): Secondary | ICD-10-CM | POA: Insufficient documentation

## 2022-01-12 LAB — CMP (CANCER CENTER ONLY)
ALT: 14 U/L (ref 0–44)
AST: 13 U/L — ABNORMAL LOW (ref 15–41)
Albumin: 3.9 g/dL (ref 3.5–5.0)
Alkaline Phosphatase: 103 U/L (ref 38–126)
Anion gap: 6 (ref 5–15)
BUN: 10 mg/dL (ref 8–23)
CO2: 32 mmol/L (ref 22–32)
Calcium: 9.7 mg/dL (ref 8.9–10.3)
Chloride: 104 mmol/L (ref 98–111)
Creatinine: 0.92 mg/dL (ref 0.44–1.00)
GFR, Estimated: >60 mL/min (ref >60)
Glucose, Bld: 117 mg/dL — ABNORMAL HIGH (ref 70–99)
Potassium: 4.2 mmol/L (ref 3.5–5.1)
Sodium: 142 mmol/L (ref 135–145)
Total Bilirubin: 0.5 mg/dL (ref 0.3–1.2)
Total Protein: 7.1 g/dL (ref 6.5–8.1)

## 2022-01-12 LAB — CBC WITH DIFFERENTIAL (CANCER CENTER ONLY)
Abs Immature Granulocytes: 0.03 10*3/uL (ref 0.00–0.07)
Basophils Absolute: 0 10*3/uL (ref 0.0–0.1)
Basophils Relative: 1 %
Eosinophils Absolute: 0.4 10*3/uL (ref 0.0–0.5)
Eosinophils Relative: 5 %
HCT: 37.7 % (ref 36.0–46.0)
Hemoglobin: 11.7 g/dL — ABNORMAL LOW (ref 12.0–15.0)
Immature Granulocytes: 0 %
Lymphocytes Relative: 34 %
Lymphs Abs: 2.7 10*3/uL (ref 0.7–4.0)
MCH: 25.1 pg — ABNORMAL LOW (ref 26.0–34.0)
MCHC: 31 g/dL (ref 30.0–36.0)
MCV: 80.9 fL (ref 80.0–100.0)
Monocytes Absolute: 0.5 10*3/uL (ref 0.1–1.0)
Monocytes Relative: 6 %
Neutro Abs: 4.3 10*3/uL (ref 1.7–7.7)
Neutrophils Relative %: 54 %
Platelet Count: 275 10*3/uL (ref 150–400)
RBC: 4.66 MIL/uL (ref 3.87–5.11)
RDW: 14.6 % (ref 11.5–15.5)
WBC Count: 8 10*3/uL (ref 4.0–10.5)
nRBC: 0 % (ref 0.0–0.2)

## 2022-01-12 LAB — D-DIMER, QUANTITATIVE: D-Dimer, Quant: 0.37 ug/mL-FEU (ref 0.00–0.50)

## 2022-01-12 NOTE — Progress Notes (Signed)
Hematology and Oncology Follow Up Visit  Natalie Carney [id number] 05/14/1956 66 y.o. 01/12/2022   Principle Diagnosis:  History of multiple thrombotic events secondary to venous insufficiency   Past Therapy: Failed Coumadin and Xarelto - recurrence    Current Therapy:   Eliquis 5 mg PO Daily   Interim History:  Natalie Carney is here today for follow-up. She is doing well but still has some post phlebitic pain and swelling off and on in the right lower extremity.  She wears compression stockings and also uses a lymphedema machine in the evenings which helps. No new pain or swelling in her extremities.  She is tolerating Eliquis nicely and taking as prescribed.  No fever, chills, n/v, cough, rash, dizziness, SOB, chest pain, palpitations, abdominal pain or changes in bowel or bladder habits.  She has constipation and uses stool softeners and eats prunes regularly. She plans to follow-up with her PCP for referral to GI down here. She has not established care with one since moving here from [place].  She has a hemorrhoid that will sometimes bleed a little with straining. No other blood loss noted. No bruising or petechiae.  No falls or syncope to report.  She has maintained a good appetite and is staying well hydrated. Her weight is stable at 260 lbs.   ECOG Performance Status: 1 - Symptomatic but completely ambulatory  Medications:  Allergies as of 01/12/2022       Reactions   Nsaids    On Blood Thinners        Medication List        Accurate as of January 12, 2022  9:53 AM. If you have any questions, ask your nurse or doctor.          apixaban 5 MG Tabs tablet Commonly known as: ELIQUIS Take 5 mg by mouth 2 (two) times daily.   betamethasone dipropionate 0.05 % ointment Commonly known as: DIPROLENE Apply topically. As needed   ciprofloxacin 500 MG tablet Commonly known as: CIPRO Take 1 tablet (500 mg total) by mouth 2 (two) times daily.   D3 2000 50 MCG (2000 UT)  Caps Generic drug: Cholecalciferol Take by mouth.   esomeprazole 40 MG capsule Commonly known as: NEXIUM Take 40 mg by mouth daily at 12 noon.   irbesartan 300 MG tablet Commonly known as: AVAPRO Take 1 tablet (300 mg total) by mouth daily.   magnesium gluconate 500 MG tablet Commonly known as: MAGONATE Take 500 mg by mouth daily.   mirabegron ER 25 MG Tb24 tablet Commonly known as: Myrbetriq Take 1 tablet (25 mg total) by mouth daily.   multivitamin-lutein Caps capsule Take 1 capsule by mouth daily.   ondansetron 8 MG disintegrating tablet Commonly known as: ZOFRAN-ODT Take 1 tablet (8 mg total) by mouth every 8 (eight) hours as needed for nausea.   Semaglutide (1 MG/DOSE) 4 MG/3ML Sopn Inject 1 mg as directed once a week.   tiZANidine 4 MG capsule Commonly known as: ZANAFLEX Take 4 mg by mouth 3 (three) times daily as needed for muscle spasms.        Allergies:  Allergies  Allergen Reactions   Nsaids     On Blood Thinners    Past Medical History, Surgical history, Social history, and Family History were reviewed and updated.  Review of Systems: All other 10 point review of systems is negative.   Physical Exam:  weight is 260 lb 4 oz (118 kg). Her oral temperature is 98.1 F (36.7 C).  Her blood pressure is 155/59 (abnormal) and her pulse is 75. Her respiration is 17 and oxygen saturation is 99%.   Wt Readings from Last 3 Encounters:  01/12/22 260 lb 4 oz (118 kg)  11/14/21 266 lb (120.7 kg)  10/26/21 268 lb (121.6 kg)    Ocular: Sclerae unicteric, pupils equal, round and reactive to light Ear-nose-throat: Oropharynx clear, dentition fair Lymphatic: No cervical or supraclavicular adenopathy Lungs no rales or rhonchi, good excursion bilaterally Heart regular rate and rhythm, no murmur appreciated Abd soft, nontender, positive bowel sounds MSK no focal spinal tenderness, no joint edema Neuro: non-focal, well-oriented, appropriate affect Breasts:  Deferred   Lab Results  Component Value Date   WBC 8.0 01/12/2022   HGB 11.7 (L) 01/12/2022   HCT 37.7 01/12/2022   MCV 80.9 01/12/2022   PLT 275 01/12/2022   No results found for: FERRITIN, IRON, TIBC, UIBC, IRONPCTSAT Lab Results  Component Value Date   RBC 4.66 01/12/2022   No results found for: KPAFRELGTCHN, LAMBDASER, KAPLAMBRATIO No results found for: IGGSERUM, IGA, IGMSERUM No results found for: Dorene Ar, A1GS, Emmit Alexanders, SPEI   Chemistry      Component Value Date/Time   NA 138 10/11/2021 0912   K 3.8 10/11/2021 0912   CL 103 10/11/2021 0912   CO2 29 10/11/2021 0912   BUN 12 10/11/2021 0912   CREATININE 0.81 10/11/2021 0912      Component Value Date/Time   CALCIUM 9.8 10/11/2021 0912   ALKPHOS 84 10/11/2021 0912   AST 12 (L) 10/11/2021 0912   ALT 15 10/11/2021 0912   BILITOT 0.6 10/11/2021 0912       Impression and Plan: Natalie Carney is a very pleasant 66 yo African American female with history of multiple thrombotic events and venous insufficiency. She continues to do well. She will continue her same regimen with Eliquis.  Follow-up in 6 months.    Eileen Stanford, NP 1/20/20239:53 AM

## 2022-01-14 LAB — CARDIOLIPIN ANTIBODIES, IGG, IGM, IGA
Anticardiolipin IgA: <9 APL U/mL (ref 0–11)
Anticardiolipin IgG: 14 GPL U/mL (ref 0–14)
Anticardiolipin IgM: 16 MPL U/mL — ABNORMAL HIGH (ref 0–12)

## 2022-01-16 ENCOUNTER — Telehealth: Payer: Self-pay

## 2022-01-16 NOTE — Telephone Encounter (Signed)
Patient reports that she was told by the pharmacy that ozempic would not be available until March. Please advise what she should do in the meantime.

## 2022-01-17 NOTE — Telephone Encounter (Signed)
Attempted to reach pt; phone rang 6 times with no answer and no VM.

## 2022-01-18 NOTE — Telephone Encounter (Signed)
Left msg for patient to return call regarding ozempic.

## 2022-01-23 NOTE — Progress Notes (Signed)
° °  GYNECOLOGY OFFICE VISIT NOTE  History:   Natalie Carney is a 66 y.o. G1P1 here today for f/u for vaginal lesion noted on 06/2021 with Dr. Earlene Plater. Bx was offered but pt declined.   She doesn't notice it and it doesn't bother her. She thinks maybe it comes and goes.   She denies any abnormal vaginal discharge, bleeding, pelvic pain or other concerns.     Past Medical History:  Diagnosis Date   Abnormal mammogram    Colon polyp    DVT (deep venous thrombosis) (HCC)    Hypertension    Pulmonary embolism (HCC)     Past Surgical History:  Procedure Laterality Date   ABDOMINAL HYSTERECTOMY     KNEE SURGERY Right    LAPAROSCOPIC GASTRIC BANDING     MYOMECTOMY     SHOULDER SURGERY Left     The following portions of the patient's history were reviewed and updated as appropriate: allergies, current medications, past family history, past medical history, past social history, past surgical history and problem list.   Health Maintenance:   S/p hyst for benign reasons  Normal mammogram on 07/2021.   Review of Systems:  Pertinent items noted in HPI and remainder of comprehensive ROS otherwise negative.  Physical Exam:  There were no vitals taken for this visit. CONSTITUTIONAL: Well-developed, well-nourished female in no acute distress.  HEENT:  Normocephalic, atraumatic. External right and left ear normal. No scleral icterus.  NECK: Normal range of motion, supple, no masses noted on observation SKIN: No rash noted. Not diaphoretic. No erythema. No pallor. MUSCULOSKELETAL: Normal range of motion. No edema noted. NEUROLOGIC: Alert and oriented to person, place, and time. Normal muscle tone coordination. No cranial nerve deficit noted. PSYCHIATRIC: Normal mood and affect. Normal behavior. Normal judgment and thought content.  CARDIOVASCULAR: Normal heart rate noted RESPIRATORY: Effort and breath sounds normal, no problems with respiration noted ABDOMEN: No masses noted. No other overt  distention noted.    PELVIC: Normal appearing external genitalia; normal urethral meatus; normal appearing vaginal mucosa.  1 cm right sided vaginal wall redundant vaginal tissue that is normal in appearance otherwise. It has no cystic appearance to it and it is not friable. It is normal in color and consistency similar to surrounding vaginal tissue. It is on the right side about 1 cm above the hymen at 9 oclock. Performed in the presence of a chaperone  Labs and Imaging No results found for this or any previous visit (from the past 168 hour(s)). No results found.  Assessment and Plan:   1. Vaginal lesion - I think this is likely from prior laceration and repair at her delivery and likely has been there long term. It has not changed in any way for the patient and does not bother her. Unless she is having surgery, she would prefer no intervention for it and I think this is acceptable. Based on the very normal appearance of the tissue, I do not think biopsy is necessary at this time and pt would prefer this as well.   Routine preventative health maintenance measures emphasized. Please refer to After Visit Summary for other counseling recommendations.   No follow-ups on file.  Milas Hock, MD, FACOG Obstetrician & Gynecologist, [facility] for [facility], [facility]

## 2022-01-25 ENCOUNTER — Encounter: Payer: Self-pay | Admitting: Obstetrics and Gynecology

## 2022-01-25 ENCOUNTER — Ambulatory Visit: Payer: No Typology Code available for payment source | Admitting: Obstetrics and Gynecology

## 2022-01-25 ENCOUNTER — Other Ambulatory Visit: Payer: Self-pay

## 2022-01-25 VITALS — BP 132/75 | HR 78 | Resp 16 | Ht 67.5 in | Wt 259.0 lb

## 2022-01-25 DIAGNOSIS — N898 Other specified noninflammatory disorders of vagina: Secondary | ICD-10-CM

## 2022-02-08 ENCOUNTER — Emergency Department
Admission: EM | Admit: 2022-02-08 | Discharge: 2022-02-08 | Disposition: A | Discharge status: Home / Self Care | Payer: No Typology Code available for payment source | Source: Home / Self Care

## 2022-02-08 ENCOUNTER — Other Ambulatory Visit: Payer: Self-pay

## 2022-02-08 DIAGNOSIS — J0100 Acute maxillary sinusitis, unspecified: Secondary | ICD-10-CM

## 2022-02-08 DIAGNOSIS — R059 Cough, unspecified: Secondary | ICD-10-CM | POA: Diagnosis not present

## 2022-02-08 MED ORDER — AMOXICILLIN-POT CLAVULANATE 875-125 MG PO TABS: 1.0000 | ORAL_TABLET | Freq: Two times a day (BID) | ORAL | 0 refills | Status: AC

## 2022-02-08 MED ORDER — BENZONATATE 200 MG PO CAPS: 200.0000 mg | ORAL_CAPSULE | Freq: Three times a day (TID) | ORAL | 0 refills | Status: AC | PRN

## 2022-02-08 NOTE — ED Triage Notes (Signed)
Pt c/o sinus and nasal congestion x 3 days. Was previously sick and sxs had resolved but returned a few days ago. Denies fever. Mucinex and coricidin prn.

## 2022-02-08 NOTE — Discharge Instructions (Addendum)
Advised patient to take medication as directed with food to completion.  Advised patient may take Tessalon Perles daily or as needed for cough.  Encouraged patient to increase daily water intake while taking these medications. 

## 2022-02-08 NOTE — ED Provider Notes (Signed)
[facility]    CSN: [id number] Arrival date & time: 02/08/22  1521      History   Chief Complaint Chief Complaint  Patient presents with   Nasal Congestion    HPI Natalie Carney is a 66 y.o. female.   HPI 66 year old female presents with sinus nasal congestion for 3 days.  Denies fever.  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 Authorizing Provider  amoxicillin-clavulanate (AUGMENTIN) 875-125 MG tablet Take 1 tablet by mouth 2 (two) times daily for 7 days. 02/08/22 02/15/22 Yes Trevor Iha, FNP  benzonatate (TESSALON) 200 MG capsule Take 1 capsule (200 mg total) by mouth 3 (three) times daily as needed for up to 7 days for cough. 02/08/22 02/15/22 Yes Trevor Iha, FNP  apixaban (ELIQUIS) 5 MG TABS tablet Take 5 mg by mouth 2 (two) times daily.    [provider]  betamethasone dipropionate (DIPROLENE) 0.05 % ointment Apply topically. As needed    [provider]  Cholecalciferol (D3 2000) 50 MCG (2000 UT) CAPS Take by mouth.    [provider]  esomeprazole (NEXIUM) 40 MG capsule Take 40 mg by mouth daily at 12 noon.    [provider]  irbesartan (AVAPRO) 300 MG tablet Take 1 tablet (300 mg total) by mouth daily. 10/11/21   Christen Butter, NP  magnesium gluconate (MAGONATE) 500 MG tablet Take 500 mg by mouth daily.    [provider]  mirabegron ER (MYRBETRIQ) 25 MG TB24 tablet Take 1 tablet (25 mg total) by mouth daily. 04/11/21   Christen Butter, NP  multivitamin-lutein (OCUVITE-LUTEIN) CAPS capsule Take 1 capsule by mouth daily.    [provider]  ondansetron (ZOFRAN-ODT) 8 MG disintegrating tablet Take 1 tablet (8 mg total) by mouth every 8 (eight) hours as needed for nausea. 04/13/21   Christen Butter, NP  Semaglutide, 1 MG/DOSE, 4 MG/3ML SOPN Inject 1 mg as directed once a week. 10/11/21   Christen Butter, NP  tiZANidine (ZANAFLEX) 4 MG capsule Take 4 mg by mouth 3 (three) times daily as needed for muscle spasms.    [provider]  Family History Family History  Problem Relation Age of Onset   Diabetes Father    Hypertension Father    Heart attack Father    Breast cancer Maternal Aunt    Cervical cancer Maternal Grandmother    Breast cancer Other    Ovarian cancer Other     Social History Social History   Tobacco Use   Smoking status: Former    Types: Cigarettes    Quit date: 12/25/1987    Years since quitting: 34.1   Smokeless tobacco: Never  Vaping Use   Vaping Use: Never used  Substance Use Topics   Alcohol use: Never   Drug use: Never     Allergies   Nsaids   Review of Systems Review of Systems  HENT:  Positive for congestion and sinus pressure.   Respiratory:  Positive for cough.   All other systems reviewed and are negative.   Physical Exam Triage Vital Signs ED Triage Vitals  Enc Vitals Group     BP 02/08/22 1535 132/77     Pulse Rate 02/08/22 1535 (!) 103      Resp 02/08/22 1535 16     Temp 02/08/22 1535 98.6 F (37 C)     Temp Source 02/08/22 1535 Oral     SpO2 02/08/22 1535 98 %     Weight --      Height --      Head Circumference --      Peak Flow --      Pain Score 02/08/22 1536 0     Pain Loc --      Pain Edu? --      Excl. in GC? --    No data found.  Updated Vital Signs BP 132/77 (BP Location: Right Arm)    Pulse (!) 103    Temp 98.6 F (37 C) (Oral)    Resp 16    SpO2 98%     Physical Exam Vitals and nursing note reviewed.  Constitutional:      General: She is not in acute distress.    Appearance: Normal appearance. She is obese. She is not ill-appearing.  HENT:     Head: Normocephalic and atraumatic.     Right Ear: Tympanic membrane, ear canal and external ear normal.     Left Ear: Tympanic membrane, ear canal and external ear normal.     Nose:     Comments: Turbinates are erythematous/edematous    Mouth/Throat:     Mouth: Mucous membranes are moist.     Pharynx: Oropharynx is clear.  Eyes:     Extraocular Movements: Extraocular movements intact.     Conjunctiva/sclera: Conjunctivae normal.     Pupils: Pupils are equal, round, and reactive to light.  Cardiovascular:     Rate and Rhythm: Normal rate and regular rhythm.     Pulses: Normal pulses.     Heart sounds: Normal heart sounds.  Pulmonary:     Effort: Pulmonary effort is normal.     Breath sounds: Normal breath sounds. No wheezing, rhonchi or rales.     Comments: Infrequent nonproductive cough noted on exam Musculoskeletal:     Cervical back: Normal range of motion and neck supple.  Skin:    General: Skin is warm and dry.  Neurological:     General: No focal deficit present.     Mental Status: She is alert and oriented to person, place, and time. Mental status is at baseline.     UC Treatments /  Results  Labs (all labs ordered are listed, but only abnormal results are displayed) Labs Reviewed - No data to display  EKG   Radiology No results  found.  Procedures Procedures (including critical care time)  Medications Ordered in UC Medications - No data to display  Initial Impression / Assessment and Plan / UC Course  I have reviewed the triage vital signs and the nursing notes.  Pertinent labs & imaging results that were available during my care of the patient were reviewed by me and considered in my medical decision making (see chart for details).     MDM: 1.  Acute maxillary sinusitis, recurrence not specified-Rx'd Augmentin; 2.  Cough-Rx'd Tessalon Perles. Advised patient to take medication as directed with food to completion.  Advised patient may take Tessalon Perles daily or as needed for cough.  Encouraged patient to increase daily water intake while taking these medications.  Patient discharged home, hemodynamically stable. Final Clinical Impressions(s) / UC Diagnoses   Final diagnoses:  Acute maxillary sinusitis, recurrence not specified  Cough, unspecified type     Discharge Instructions      Advised patient to take medication as directed with food to completion.  Advised patient may take Tessalon Perles daily or as needed for cough.  Encouraged patient to increase daily water intake while taking these medications.     ED Prescriptions     Medication Sig Dispense Auth. Provider   amoxicillin-clavulanate (AUGMENTIN) 875-125 MG tablet Take 1 tablet by mouth 2 (two) times daily for 7 days. 14 tablet Trevor Iha, FNP   benzonatate (TESSALON) 200 MG capsule Take 1 capsule (200 mg total) by mouth 3 (three) times daily as needed for up to 7 days for cough. 40 capsule Trevor Iha, FNP      PDMP not reviewed this encounter.   Trevor Iha, FNP 02/08/22 1622

## 2022-02-16 ENCOUNTER — Telehealth: Payer: Self-pay | Admitting: Family Medicine

## 2022-02-16 MED ORDER — PROMETHAZINE-DM 6.25-15 MG/5ML PO SYRP: 5.0000 mL | ORAL_SOLUTION | Freq: Two times a day (BID) | ORAL | 0 refills | Status: DC | PRN

## 2022-02-16 NOTE — Telephone Encounter (Signed)
Promethazine DM called in for patient's cough.

## 2022-02-26 ENCOUNTER — Other Ambulatory Visit: Payer: Self-pay

## 2022-02-26 MED ORDER — MIRABEGRON ER 50 MG PO TB24: 50.0000 mg | ORAL_TABLET | Freq: Every day | ORAL | 0 refills | Status: DC

## 2022-02-26 NOTE — Progress Notes (Signed)
Natalie Carney is a 66 y.o. female called in for a refill on her Myrbetriq that was increased during her last visit. Pt was to have a pessary fitting and med f/u but cancelled the pt.  ? ?30 day supply of Myrbetriq 50 mg has been sent to the pharmacy. Pt was scheduled for a f/u on 4/41/22@ 2:40pm for more refills ?

## 2022-03-16 ENCOUNTER — Other Ambulatory Visit: Payer: Self-pay | Admitting: Medical-Surgical

## 2022-04-02 ENCOUNTER — Encounter: Payer: Self-pay | Admitting: Obstetrics and Gynecology

## 2022-04-02 ENCOUNTER — Ambulatory Visit (INDEPENDENT_AMBULATORY_CARE_PROVIDER_SITE_OTHER): Payer: No Typology Code available for payment source | Admitting: Obstetrics and Gynecology

## 2022-04-02 VITALS — BP 135/76 | HR 66

## 2022-04-02 DIAGNOSIS — N816 Rectocele: Secondary | ICD-10-CM | POA: Diagnosis not present

## 2022-04-02 DIAGNOSIS — N3941 Urge incontinence: Secondary | ICD-10-CM

## 2022-04-02 DIAGNOSIS — R319 Hematuria, unspecified: Secondary | ICD-10-CM

## 2022-04-02 MED ORDER — MIRABEGRON ER 50 MG PO TB24: 50.0000 mg | ORAL_TABLET | Freq: Every day | ORAL | 11 refills | Status: DC

## 2022-04-02 MED ORDER — MIRABEGRON ER 50 MG PO TB24: 50.0000 mg | ORAL_TABLET | Freq: Every day | ORAL | 0 refills | Status: DC

## 2022-04-02 NOTE — Progress Notes (Signed)
[facility] ?Return Visit ? ?SUBJECTIVE  ?History of Present Illness: ?Karely Hurtado is a 66 y.o. female seen in follow-up for overactive bladder. Plan at last visit was to increase dose of Myrbetriq to 50mg .  ? ?She is able to get to the bathroom without urgency. Sometimes in the early morning will have to go multiple times, but other than that she is not waking up at night.  ? ?Reports that she has been more bothered by her prolapse. Feels the bulge especially with sitting.  ? ?Had a CT for her hematuria: ?IMPRESSION: ?1. No CT findings to explain hematuria. No evidence of urinary tract ?calculus, hydronephrosis, or urinary tract filling defect. ?2. Status post hysterectomy. ?3. Cholelithiasis. ? ?Past Medical History: ?Patient  has a past medical history of Abnormal mammogram, Colon polyp, DVT (deep venous thrombosis) (HCC), Hypertension, and Pulmonary embolism (HCC).  ? ?Past Surgical History: ?She  has a past surgical history that includes Abdominal hysterectomy; Shoulder surgery (Left); Knee surgery (Right); Laparoscopic gastric banding; and Myomectomy.  ? ?Medications: ?She has a current medication list which includes the following prescription(s): apixaban, d3 2000, esomeprazole, irbesartan, magnesium gluconate, multivitamin-lutein, ondansetron, semaglutide (1 mg/dose), tizanidine, and mirabegron er.  ? ?Allergies: ?Patient is allergic to nsaids.  ? ?Social History: ?Patient  reports that she quit smoking about 34 years ago. Her smoking use included cigarettes. She has never used smokeless tobacco. She reports that she does not drink alcohol and does not use drugs.  ?  ?  ?OBJECTIVE  ?  ? ?Physical Exam: ?Vitals:  ? 04/02/22 1456  ?BP: 135/76  ?Pulse: 66  ? ?Gen: No apparent distress, A&O x 3. ? ?Detailed Urogynecologic Evaluation:  ?Deferred. Prior exam showed: ? ?POP-Q:  ?  ?POP-Q ?  ?-2  ?                                          Aa   ?-2 ?                                          Ba   ?-8   ?                                            C  ?  ?3.5  ?                                          Gh   ?4  ?                                          Pb   ?8.5  ?                                          tvl  ?  ?0  ?  Ap   ?0  ?                                          Bp   ?   ?                                            D  ?  ?   ? ?ASSESSMENT AND PLAN  ?  ?Ms. Insco is a 66 y.o. with:  ?1. Urge incontinence   ?2. Prolapse of posterior vaginal wall   ?3. Hematuria, unspecified type   ? ?Urge incontinence ?- continue with Myrbetriq 50 mg. Refill provided.  ? ?2. POP ?- Initially she was interested in a pessary but does not want to deal with the management. We discussed the option of a posterior repair and she would like to proceed. Risk, benefits reviewed. Will have her return for a pre op visit.  ? ?3. Microscopic hematuria ?- negative workup with CT and cystoscopy ?- repeat UA in a year (09/2022) ? ?Will send request for surgery scheduling.  ? ?Marguerita Beards, MD ? ? ? ?

## 2022-04-03 ENCOUNTER — Encounter: Payer: Self-pay | Admitting: Obstetrics and Gynecology

## 2022-04-03 DIAGNOSIS — N3941 Urge incontinence: Secondary | ICD-10-CM | POA: Insufficient documentation

## 2022-04-03 DIAGNOSIS — N816 Rectocele: Secondary | ICD-10-CM | POA: Insufficient documentation

## 2022-04-03 DIAGNOSIS — R319 Hematuria, unspecified: Secondary | ICD-10-CM | POA: Insufficient documentation

## 2022-04-06 ENCOUNTER — Other Ambulatory Visit: Payer: Self-pay

## 2022-04-06 DIAGNOSIS — N3941 Urge incontinence: Secondary | ICD-10-CM

## 2022-04-06 MED ORDER — MIRABEGRON ER 50 MG PO TB24: 50.0000 mg | ORAL_TABLET | Freq: Every day | ORAL | 3 refills | Status: DC

## 2022-04-06 MED ORDER — IRBESARTAN 300 MG PO TABS
300.0000 mg | ORAL_TABLET | Freq: Every day | ORAL | 0 refills | Status: DC
Start: 2022-04-06 — End: 2022-07-18

## 2022-04-06 MED ORDER — APIXABAN 5 MG PO TABS: 5.0000 mg | ORAL_TABLET | Freq: Two times a day (BID) | ORAL | 0 refills | Status: DC

## 2022-04-15 ENCOUNTER — Other Ambulatory Visit: Payer: Self-pay | Admitting: Medical-Surgical

## 2022-05-02 ENCOUNTER — Other Ambulatory Visit: Payer: Self-pay | Admitting: Medical-Surgical

## 2022-05-03 ENCOUNTER — Telehealth: Payer: Self-pay | Admitting: *Deleted

## 2022-05-03 NOTE — Telephone Encounter (Signed)
Message received from patient requesting a refill of Eliquis be sent to the [facility] on [address] in [place].  Message sent to S. Eulas Post NP regarding refill.  ? ? ?

## 2022-05-04 ENCOUNTER — Other Ambulatory Visit: Payer: Self-pay | Admitting: Family

## 2022-05-04 DIAGNOSIS — I2699 Other pulmonary embolism without acute cor pulmonale: Secondary | ICD-10-CM

## 2022-05-04 DIAGNOSIS — I824Y9 Acute embolism and thrombosis of unspecified deep veins of unspecified proximal lower extremity: Secondary | ICD-10-CM

## 2022-05-04 MED ORDER — APIXABAN 5 MG PO TABS: 5.0000 mg | ORAL_TABLET | Freq: Two times a day (BID) | ORAL | 3 refills | Status: DC

## 2022-05-29 ENCOUNTER — Encounter: Payer: Self-pay | Admitting: Obstetrics and Gynecology

## 2022-05-29 ENCOUNTER — Ambulatory Visit (INDEPENDENT_AMBULATORY_CARE_PROVIDER_SITE_OTHER): Payer: No Typology Code available for payment source | Admitting: Obstetrics and Gynecology

## 2022-05-29 VITALS — BP 134/72 | HR 70

## 2022-05-29 DIAGNOSIS — Z01818 Encounter for other preprocedural examination: Secondary | ICD-10-CM

## 2022-05-29 MED ORDER — OXYCODONE HCL 5 MG PO TABS: 5.0000 mg | ORAL_TABLET | ORAL | 0 refills | Status: DC | PRN

## 2022-05-29 MED ORDER — ACETAMINOPHEN 500 MG PO TABS: 500.0000 mg | ORAL_TABLET | Freq: Four times a day (QID) | ORAL | 0 refills | Status: AC | PRN

## 2022-05-29 MED ORDER — POLYETHYLENE GLYCOL 3350 17 GM/SCOOP PO POWD: 17.0000 g | Freq: Every day | ORAL | 0 refills | Status: DC

## 2022-05-29 NOTE — Progress Notes (Signed)
[name] visit  Subjective Chief Complaint: Natalie Carney presents for a preoperative encounter.   History of Present Illness: Natalie Carney is a 66 y.o. female who presents for preoperative visit.  She is scheduled to undergo Exam under anesthesia, posterior repair, possible sacrospinous ligament fixation on 06/18/22.  Her symptoms include vaginal bulge, and she was was found to have Stage I anterior, Stage II posterior, Stage I apical prolapse.  Last A1c was 10/11/21 5.7- pt is prediabetic   Past Medical History:  Diagnosis Date   Abnormal mammogram    Colon polyp    DVT (deep venous thrombosis) (HCC)    Hypertension    Pulmonary embolism (HCC)      Past Surgical History:  Procedure Laterality Date   ABDOMINAL HYSTERECTOMY     KNEE SURGERY Right    LAPAROSCOPIC GASTRIC BANDING     MYOMECTOMY     SHOULDER SURGERY Left     is allergic to nsaids.   Family History  Problem Relation Age of Onset   Diabetes Father    Hypertension Father    Heart attack Father    Breast cancer Maternal Aunt    Cervical cancer Maternal Grandmother    Breast cancer Other    Ovarian cancer Other     Social History   Tobacco Use   Smoking status: Former    Types: Cigarettes    Quit date: 12/25/1987    Years since quitting: 34.4   Smokeless tobacco: Never  Vaping Use   Vaping Use: Never used  Substance Use Topics   Alcohol use: Never   Drug use: Never     Review of Systems was negative for a full 10 system review except as noted in the History of Present Illness.   Current Outpatient Medications:    apixaban (ELIQUIS) 5 MG TABS tablet, Take 1 tablet (5 mg total) by mouth 2 (two) times daily., Disp: 60 tablet, Rfl: 3   Cholecalciferol (D3 2000) 50 MCG (2000 UT) CAPS, Take by mouth., Disp: , Rfl:    esomeprazole (NEXIUM) 40 MG capsule, Take 40 mg by mouth daily at 12 noon., Disp: , Rfl:    irbesartan (AVAPRO) 300 MG tablet, Take 1 tablet (300 mg total)  by mouth daily., Disp: 90 tablet, Rfl: 0   magnesium gluconate (MAGONATE) 500 MG tablet, Take 500 mg by mouth daily., Disp: , Rfl:    mirabegron ER (MYRBETRIQ) 50 MG TB24 tablet, Take 1 tablet (50 mg total) by mouth daily., Disp: 90 tablet, Rfl: 3   multivitamin-lutein (OCUVITE-LUTEIN) CAPS capsule, Take 1 capsule by mouth daily., Disp: , Rfl:    ondansetron (ZOFRAN-ODT) 8 MG disintegrating tablet, Take 1 tablet (8 mg total) by mouth every 8 (eight) hours as needed for nausea., Disp: 20 tablet, Rfl: 3   Semaglutide, 1 MG/DOSE, 4 MG/3ML SOPN, Inject 1 mg as directed once a week., Disp: 9 mL, Rfl: 3   Objective Vitals:   05/29/22 1359  BP: 134/72  Pulse: 70    Gen: NAD CV: S1 S2 RRR Lungs: Clear to auscultation bilaterally Abd: soft, nontender   Previous Pelvic Exam showed: POP-Q:    POP-Q   -2                                            Aa   -2  Ba   -8                                              C    3.5                                            Gh   4                                            Pb   8.5                                            tvl    0                                            Ap   0                                            Bp                                                  D       Assessment/ Plan  Assessment: The patient is a 66 y.o. year old scheduled to undergo Exam under anesthesia, posterior repair. Verbal consent was obtained for these procedures.  Plan: General Surgical Consent: The patient has previously been counseled on alternative treatments, and the decision by the patient and provider was to proceed with the procedure listed above.  For all procedures, there are risks of bleeding, infection, damage to surrounding organs including but not limited to bowel, bladder, blood vessels, ureters and nerves, and need for further surgery if an injury were to occur. These risks are all low  with minimally invasive surgery.   There are risks of numbness and weakness at any body site or buttock/rectal pain.  It is possible that baseline pain can be worsened by surgery, either with or without mesh. If surgery is vaginal, there is also a low risk of possible conversion to laparoscopy or open abdominal incision where indicated. Very rare risks include blood transfusion, blood clot, heart attack, pneumonia, or death.   There is also a risk of short-term postoperative urinary retention with need to use a catheter. About half of patients need to go home from surgery with a catheter, which is then later removed in the office. The risk of long-term need for a catheter is very low. There is also a risk of worsening of overactive bladder.     Prolapse (with or without mesh): Risk factors for surgical failure  include things that put pressure on your pelvis and the surgical  repair, including obesity, chronic cough, and heavy lifting or straining (including lifting children or adults, straining on the toilet, or lifting heavy objects such as furniture or anything weighing >25 lbs. Risks of recurrence is 20-30% with vaginal native tissue repair and a less than 10% with sacrocolpopexy with mesh.     Pre-operative instructions:  She was instructed to not take Aspirin/NSAIDs x 7days prior to surgery. Eileen Stanford, NP (Heme/ Onc) contacted regarding when to stop Eliquis prior to surgery.  Antibiotic prophylaxis was ordered as indicated.  Cathter use: Patient will go home with foley if needed after post-operative voiding trial.  Post-operative instructions:  She was provided with specific post-operative instructions, including precautions and signs/symptoms for which we would recommend contacting us, in addition to daytime and after-hours contact phone numbers. This was provided on a handout.   Post-operative medications: Prescriptions for tylenol, miralax, and oxycodone were sent to her pharmacy.    Laboratory testing:  We will check labs: CBC.   Preoperative clearance:  She does not require surgical clearance.    Post-operative follow-up:  A post-operative appointment will be made for 6 weeks from the date of surgery. If she needs a post-operative nurse visit for a voiding trial, that will be set up after she leaves the hospital.    Patient will call the clinic or use MyChart should anything change or any new issues arise.   Marguerita Beards, MD   Time spent: I spent 20 minutes dedicated to the care of this patient on the date of this encounter to include pre-visit review of records, face-to-face time with the patient  and post visit documentation and ordering medication/ testing.

## 2022-05-31 ENCOUNTER — Encounter: Payer: Self-pay | Admitting: Obstetrics and Gynecology

## 2022-05-31 NOTE — H&P (Signed)
[facility] Pre-Operative H&P  Subjective Chief Complaint: Natalie Carney presents for a preoperative encounter.   History of Present Illness: Beula Joyner is a 66 y.o. female who presents for preoperative visit.  She is scheduled to undergo Exam under anesthesia, posterior repair, possible sacrospinous ligament fixation on 06/18/22.  Her symptoms include vaginal bulge, and she was was found to have Stage I anterior, Stage II posterior, Stage I apical prolapse.  Last A1c was 10/11/21 5.7- pt is prediabetic   Past Medical History:  Diagnosis Date   Abnormal mammogram    Colon polyp    DVT (deep venous thrombosis) (HCC)    Hypertension    Pulmonary embolism (HCC)      Past Surgical History:  Procedure Laterality Date   ABDOMINAL HYSTERECTOMY     KNEE SURGERY Right    LAPAROSCOPIC GASTRIC BANDING     MYOMECTOMY     SHOULDER SURGERY Left     is allergic to nsaids.   Family History  Problem Relation Age of Onset   Diabetes Father    Hypertension Father    Heart attack Father    Breast cancer Maternal Aunt    Cervical cancer Maternal Grandmother    Breast cancer Other    Ovarian cancer Other     Social History   Tobacco Use   Smoking status: Former    Types: Cigarettes    Quit date: 12/25/1987    Years since quitting: 34.4   Smokeless tobacco: Never  Vaping Use   Vaping Use: Never used  Substance Use Topics   Alcohol use: Never   Drug use: Never     Review of Systems was negative for a full 10 system review except as noted in the History of Present Illness.  No current facility-administered medications for this encounter.  Current Outpatient Medications:    acetaminophen (TYLENOL) 500 MG tablet, Take 1 tablet (500 mg total) by mouth every 6 (six) hours as needed (pain)., Disp: 30 tablet, Rfl: 0   apixaban (ELIQUIS) 5 MG TABS tablet, Take 1 tablet (5 mg total) by mouth 2 (two) times daily., Disp: 60 tablet, Rfl: 3   Cholecalciferol (D3 2000) 50  MCG (2000 UT) CAPS, Take by mouth., Disp: , Rfl:    esomeprazole (NEXIUM) 40 MG capsule, Take 40 mg by mouth daily at 12 noon., Disp: , Rfl:    irbesartan (AVAPRO) 300 MG tablet, Take 1 tablet (300 mg total) by mouth daily., Disp: 90 tablet, Rfl: 0   magnesium gluconate (MAGONATE) 500 MG tablet, Take 500 mg by mouth daily., Disp: , Rfl:    mirabegron ER (MYRBETRIQ) 50 MG TB24 tablet, Take 1 tablet (50 mg total) by mouth daily., Disp: 90 tablet, Rfl: 3   multivitamin-lutein (OCUVITE-LUTEIN) CAPS capsule, Take 1 capsule by mouth daily., Disp: , Rfl:    ondansetron (ZOFRAN-ODT) 8 MG disintegrating tablet, Take 1 tablet (8 mg total) by mouth every 8 (eight) hours as needed for nausea., Disp: 20 tablet, Rfl: 3   oxyCODONE (OXY IR/ROXICODONE) 5 MG immediate release tablet, Take 1 tablet (5 mg total) by mouth every 4 (four) hours as needed for severe pain., Disp: 10 tablet, Rfl: 0   polyethylene glycol powder (GLYCOLAX/MIRALAX) 17 GM/SCOOP powder, Take 17 g by mouth daily. Drink 17g (1 scoop) dissolved in water per day., Disp: 255 g, Rfl: 0   Semaglutide, 1 MG/DOSE, 4 MG/3ML SOPN, Inject 1 mg as directed once a week., Disp: 9 mL, Rfl: 3   Objective There were  no vitals filed for this visit.   Gen: NAD CV: S1 S2 RRR Lungs: Clear to auscultation bilaterally Abd: soft, nontender   Previous Pelvic Exam showed: POP-Q:    POP-Q   -2                                            Aa   -2                                           Ba   -8                                              C    3.5                                            Gh   4                                            Pb   8.5                                            tvl    0                                            Ap   0                                            Bp                                                  D       Assessment/ Plan  Assessment: The patient is a 66 y.o. year old with stage II posterior  prolapse scheduled to undergo Exam under anesthesia, posterior repair, possible sacrospinous ligament fixation.   Marguerita Beards, MD

## 2022-06-07 ENCOUNTER — Telehealth: Payer: Self-pay

## 2022-06-07 NOTE — Telephone Encounter (Signed)
-----   Message from Marguerita Beards, MD sent at 06/07/2022 11:30 AM EDT ----- Regarding: meds before surgery Can you call the patient and tell her she needs to stop her Eliquis 2 days prior to the surgery. She will restart it the day after surgery. And she does not need to stop the ozempic.   Thanks!

## 2022-06-07 NOTE — Telephone Encounter (Signed)
Natalie Carney is a 66 y.o. female was contacted re: message from Dr. Florian Buff. Pt verbalized understanding.

## 2022-06-13 NOTE — Progress Notes (Signed)
Pt states that Dr. Florian Buff contaected Eileen Stanford NP, and pt is to stop eliquis 2 days before surgery.   IB message was sent to dr. Florian Buff to document that she spoke with Eileen Stanford about stopping eliquis.

## 2022-06-14 ENCOUNTER — Encounter (HOSPITAL_BASED_OUTPATIENT_CLINIC_OR_DEPARTMENT_OTHER): Payer: Self-pay | Admitting: Obstetrics and Gynecology

## 2022-06-14 ENCOUNTER — Telehealth: Payer: Self-pay | Admitting: Obstetrics and Gynecology

## 2022-06-14 ENCOUNTER — Other Ambulatory Visit: Payer: Self-pay

## 2022-06-14 ENCOUNTER — Telehealth: Payer: Self-pay

## 2022-06-14 NOTE — Progress Notes (Signed)
Spoke w/ via phone for pre-op interview---pt Lab needs dos---- cbc              Lab results------ COVID test -----patient states asymptomatic no test needed Arrive at -------930 am 06-18-2022 NPO after MN NO Solid Food.  Clear liquids from MN until---830 am Med rec completed Medications to take morning of surgery ----- myrbettriq, nexium Diabetic medication -----n/a Patient instructed no nail polish to be worn day of surgery Patient instructed to bring photo id and insurance card day of surgery Patient aware to have Driver (ride ) / caregiver    husband lance for 24 hours after surgery  Patient Special Instructions -----pt stated  to stop eliquis 2 days before surgery  per dr schroeder, last dose is 06-15-2022 Pre-Op special Istructions -----none Patient verbalized understanding of instructions that were given at this phone interview. Patient denies shortness of breath, chest pain, fever, cough at this phone interview.

## 2022-06-14 NOTE — Telephone Encounter (Signed)
Attempt made to contact Natalie Carney is a 66 y.o. female re: message from Dr. Florian Buff to stop eliquis 2 day before surgery.  Pt was not available.  LM on the VM for the patient to call me back.

## 2022-06-14 NOTE — Telephone Encounter (Signed)
-----   Message from Erenest Blank, NP sent at 06/01/2022  3:30 PM EDT ----- Regarding: RE: Recommendations for Eliquis We would recommend that she hold her Eliquis starting 2 days prior to surgery. Thank you! Please let us know if you have any other questions or concerns.   Sarah  ----- Message ----- From: Marguerita Beards, MD Sent: 05/31/2022  11:03 AM EDT To: Erenest Blank, NP Subject: Recommendations for Eliquis                    Hello Ms Montez Morita,   I am a Urogynecologist at [facility] and am caring for a mutual patient, Natalie Carney. We are planning surgery on 6/26 and I wanted to contact you for recommendations regarding her Eliquis. How long prior to surgery would you recommend holding? I will plan to give her pre-operative anticoagulation and she should be able to restart Eliquis the next day as long as there are no bleeding concerns. I appreciate your recommendations.  Thank you,   Lanetta Inch, MD  [facility]

## 2022-06-14 NOTE — Telephone Encounter (Signed)
-----   Message from Marguerita Beards, MD sent at 06/13/2022  1:27 PM EDT ----- Regarding: FW: documentation when to stop eliquis Can you let the patient know that she should stop her Eliquis 2 days prior to surgery. Thanks ----- Message ----- From: Pietro Cassis, RN Sent: 06/13/2022   9:35 AM EDT To: Marguerita Beards, MD Subject: documentation when to stop eliquis             Please document in  pt chart when pt is to stop eliquis.  Thank you

## 2022-06-18 ENCOUNTER — Encounter (HOSPITAL_BASED_OUTPATIENT_CLINIC_OR_DEPARTMENT_OTHER): Payer: Self-pay | Admitting: Obstetrics and Gynecology

## 2022-06-18 ENCOUNTER — Encounter (HOSPITAL_BASED_OUTPATIENT_CLINIC_OR_DEPARTMENT_OTHER)
Admission: RE | Disposition: A | Discharge status: Home / Self Care | Payer: Self-pay | Source: Home / Self Care | Attending: Obstetrics and Gynecology

## 2022-06-18 ENCOUNTER — Ambulatory Visit (HOSPITAL_BASED_OUTPATIENT_CLINIC_OR_DEPARTMENT_OTHER): Payer: BLUE CROSS/BLUE SHIELD | Admitting: Anesthesiology

## 2022-06-18 ENCOUNTER — Other Ambulatory Visit: Payer: Self-pay

## 2022-06-18 ENCOUNTER — Ambulatory Visit (HOSPITAL_BASED_OUTPATIENT_CLINIC_OR_DEPARTMENT_OTHER)
Admission: RE | Admit: 2022-06-18 | Discharge: 2022-06-18 | Disposition: A | Discharge status: Home / Self Care | Payer: BLUE CROSS/BLUE SHIELD | Attending: Obstetrics and Gynecology | Admitting: Obstetrics and Gynecology

## 2022-06-18 DIAGNOSIS — N815 Vaginal enterocele: Secondary | ICD-10-CM

## 2022-06-18 DIAGNOSIS — Z87891 Personal history of nicotine dependence: Secondary | ICD-10-CM | POA: Diagnosis not present

## 2022-06-18 DIAGNOSIS — N816 Rectocele: Secondary | ICD-10-CM

## 2022-06-18 DIAGNOSIS — K219 Gastro-esophageal reflux disease without esophagitis: Secondary | ICD-10-CM | POA: Insufficient documentation

## 2022-06-18 DIAGNOSIS — N993 Prolapse of vaginal vault after hysterectomy: Secondary | ICD-10-CM | POA: Diagnosis not present

## 2022-06-18 DIAGNOSIS — Z6841 Body Mass Index (BMI) 40.0 and over, adult: Secondary | ICD-10-CM | POA: Insufficient documentation

## 2022-06-18 DIAGNOSIS — I10 Essential (primary) hypertension: Secondary | ICD-10-CM | POA: Diagnosis not present

## 2022-06-18 DIAGNOSIS — E119 Type 2 diabetes mellitus without complications: Secondary | ICD-10-CM

## 2022-06-18 DIAGNOSIS — Z01818 Encounter for other preprocedural examination: Secondary | ICD-10-CM

## 2022-06-18 DIAGNOSIS — Z7985 Long-term (current) use of injectable non-insulin antidiabetic drugs: Secondary | ICD-10-CM

## 2022-06-18 DIAGNOSIS — Z7901 Long term (current) use of anticoagulants: Secondary | ICD-10-CM

## 2022-06-18 HISTORY — DX: Unspecified osteoarthritis, unspecified site: M19.90

## 2022-06-18 HISTORY — DX: Other complications of anesthesia, initial encounter: T88.59XA

## 2022-06-18 HISTORY — DX: Lymphedema, not elsewhere classified: I89.0

## 2022-06-18 HISTORY — PX: RECTOCELE REPAIR: SHX761

## 2022-06-18 HISTORY — DX: Other specified postprocedural states: R11.2

## 2022-06-18 HISTORY — DX: Rectocele: N81.6

## 2022-06-18 HISTORY — DX: Other specified postprocedural states: Z98.890

## 2022-06-18 HISTORY — DX: Gastro-esophageal reflux disease without esophagitis: K21.9

## 2022-06-18 HISTORY — DX: Type 2 diabetes mellitus without complications: E11.9

## 2022-06-18 LAB — CBC
HCT: 36.4 % (ref 36.0–46.0)
Hemoglobin: 11.6 g/dL — ABNORMAL LOW (ref 12.0–15.0)
MCH: 25.7 pg — ABNORMAL LOW (ref 26.0–34.0)
MCHC: 31.9 g/dL (ref 30.0–36.0)
MCV: 80.7 fL (ref 80.0–100.0)
Platelets: 253 10*3/uL (ref 150–400)
RBC: 4.51 MIL/uL (ref 3.87–5.11)
RDW: 14.5 % (ref 11.5–15.5)
WBC: 7.3 10*3/uL (ref 4.0–10.5)
nRBC: 0 % (ref 0.0–0.2)

## 2022-06-18 SURGERY — COLPORRHAPHY, POSTERIOR, FOR RECTOCELE REPAIR
Anesthesia: General

## 2022-06-18 MED ORDER — LIDOCAINE HCL (CARDIAC) PF 100 MG/5ML IV SOSY
PREFILLED_SYRINGE | INTRAVENOUS | Status: DC | PRN
  Administered 2022-06-18: 100 mg via INTRAVENOUS

## 2022-06-18 MED ORDER — OXYCODONE HCL 5 MG PO TABS
5.0000 mg | ORAL_TABLET | Freq: Once | ORAL | Status: AC | PRN
  Administered 2022-06-18: 5 mg via ORAL

## 2022-06-18 MED ORDER — EPHEDRINE 5 MG/ML INJ
INTRAVENOUS | Status: AC
  Filled 2022-06-18: qty 5

## 2022-06-18 MED ORDER — MIDAZOLAM HCL 5 MG/5ML IJ SOLN
INTRAMUSCULAR | Status: DC | PRN
  Administered 2022-06-18: 2 mg via INTRAVENOUS

## 2022-06-18 MED ORDER — DEXAMETHASONE SODIUM PHOSPHATE 4 MG/ML IJ SOLN
INTRAMUSCULAR | Status: DC | PRN
  Administered 2022-06-18: 5 mg via INTRAVENOUS

## 2022-06-18 MED ORDER — EPHEDRINE SULFATE-NACL 50-0.9 MG/10ML-% IV SOSY
PREFILLED_SYRINGE | INTRAVENOUS | Status: DC | PRN
  Administered 2022-06-18: 5 mg via INTRAVENOUS

## 2022-06-18 MED ORDER — CEFAZOLIN SODIUM-DEXTROSE 2-4 GM/100ML-% IV SOLN
2.0000 g | INTRAVENOUS | Status: AC
  Administered 2022-06-18: 2 g via INTRAVENOUS

## 2022-06-18 MED ORDER — LACTATED RINGERS IV SOLN: INTRAVENOUS | Status: DC

## 2022-06-18 MED ORDER — MEPERIDINE HCL 25 MG/ML IJ SOLN: 6.2500 mg | INTRAMUSCULAR | Status: DC | PRN

## 2022-06-18 MED ORDER — FENTANYL CITRATE (PF) 100 MCG/2ML IJ SOLN
INTRAMUSCULAR | Status: DC | PRN
  Administered 2022-06-18 (×2): 50 ug via INTRAVENOUS
  Administered 2022-06-18 (×2): 25 ug via INTRAVENOUS
  Administered 2022-06-18: 50 ug via INTRAVENOUS

## 2022-06-18 MED ORDER — AMISULPRIDE (ANTIEMETIC) 5 MG/2ML IV SOLN: 10.0000 mg | Freq: Once | INTRAVENOUS | Status: DC | PRN

## 2022-06-18 MED ORDER — ACETAMINOPHEN 500 MG PO TABS
ORAL_TABLET | ORAL | Status: AC
  Filled 2022-06-18: qty 2

## 2022-06-18 MED ORDER — PROPOFOL 10 MG/ML IV BOLUS
INTRAVENOUS | Status: DC | PRN
  Administered 2022-06-18: 200 mg via INTRAVENOUS

## 2022-06-18 MED ORDER — OXYCODONE HCL 5 MG PO TABS
ORAL_TABLET | ORAL | Status: AC
  Filled 2022-06-18: qty 1

## 2022-06-18 MED ORDER — ACETAMINOPHEN 500 MG PO TABS
1000.0000 mg | ORAL_TABLET | ORAL | Status: AC
  Administered 2022-06-18: 1000 mg via ORAL

## 2022-06-18 MED ORDER — OXYCODONE HCL 5 MG/5ML PO SOLN: 5.0000 mg | Freq: Once | ORAL | Status: AC | PRN

## 2022-06-18 MED ORDER — SCOPOLAMINE 1 MG/3DAYS TD PT72
1.0000 | MEDICATED_PATCH | TRANSDERMAL | Status: DC
  Administered 2022-06-18: 1.5 mg via TRANSDERMAL

## 2022-06-18 MED ORDER — CEFAZOLIN SODIUM-DEXTROSE 2-4 GM/100ML-% IV SOLN
INTRAVENOUS | Status: AC
  Filled 2022-06-18: qty 100

## 2022-06-18 MED ORDER — MIDAZOLAM HCL 2 MG/2ML IJ SOLN
INTRAMUSCULAR | Status: AC
  Filled 2022-06-18: qty 2

## 2022-06-18 MED ORDER — ENOXAPARIN SODIUM 40 MG/0.4ML IJ SOSY
PREFILLED_SYRINGE | INTRAMUSCULAR | Status: AC
  Filled 2022-06-18: qty 0.4

## 2022-06-18 MED ORDER — LIDOCAINE HCL (PF) 2 % IJ SOLN
INTRAMUSCULAR | Status: AC
  Filled 2022-06-18: qty 5

## 2022-06-18 MED ORDER — FENTANYL CITRATE (PF) 100 MCG/2ML IJ SOLN
INTRAMUSCULAR | Status: AC
  Filled 2022-06-18: qty 2

## 2022-06-18 MED ORDER — ENOXAPARIN SODIUM 40 MG/0.4ML IJ SOSY
40.0000 mg | PREFILLED_SYRINGE | INTRAMUSCULAR | Status: AC
  Administered 2022-06-18: 40 mg via SUBCUTANEOUS

## 2022-06-18 MED ORDER — ONDANSETRON HCL 4 MG/2ML IJ SOLN
INTRAMUSCULAR | Status: DC | PRN
  Administered 2022-06-18: 4 mg via INTRAVENOUS

## 2022-06-18 MED ORDER — FENTANYL CITRATE (PF) 100 MCG/2ML IJ SOLN: 25.0000 ug | INTRAMUSCULAR | Status: DC | PRN

## 2022-06-18 MED ORDER — PROPOFOL 10 MG/ML IV BOLUS
INTRAVENOUS | Status: AC
  Filled 2022-06-18: qty 20

## 2022-06-18 MED ORDER — SURGIFLO WITH THROMBIN (HEMOSTATIC MATRIX KIT) OPTIME
TOPICAL | Status: DC | PRN
  Administered 2022-06-18: 1 via TOPICAL

## 2022-06-18 MED ORDER — SCOPOLAMINE 1 MG/3DAYS TD PT72
MEDICATED_PATCH | TRANSDERMAL | Status: AC
  Filled 2022-06-18: qty 1

## 2022-06-18 MED ORDER — DEXAMETHASONE SODIUM PHOSPHATE 10 MG/ML IJ SOLN
INTRAMUSCULAR | Status: AC
  Filled 2022-06-18: qty 1

## 2022-06-18 MED ORDER — LIDOCAINE-EPINEPHRINE 1 %-1:100000 IJ SOLN
INTRAMUSCULAR | Status: DC | PRN
  Administered 2022-06-18: 10 mL

## 2022-06-18 MED ORDER — ONDANSETRON HCL 4 MG/2ML IJ SOLN
INTRAMUSCULAR | Status: AC
  Filled 2022-06-18: qty 2

## 2022-06-18 SURGICAL SUPPLY — 26 items
AGENT HMST KT MTR STRL THRMB (HEMOSTASIS) ×1
BLADE CLIPPER SENSICLIP SURGIC (BLADE) ×2 IMPLANT
BLADE SURG 15 STRL LF DISP TIS (BLADE) ×1 IMPLANT
BLADE SURG 15 STRL SS (BLADE) ×2
GLOVE BIO SURGEON STRL SZ 6 (GLOVE) ×2 IMPLANT
GLOVE BIOGEL PI IND STRL 6.5 (GLOVE) ×1 IMPLANT
GLOVE BIOGEL PI IND STRL 7.0 (GLOVE) ×1 IMPLANT
GLOVE BIOGEL PI INDICATOR 6.5 (GLOVE) ×1
GLOVE BIOGEL PI INDICATOR 7.0 (GLOVE) ×1
GOWN STRL REUS W/TWL LRG LVL3 (GOWN DISPOSABLE) ×3 IMPLANT
HIBICLENS CHG 4% 4OZ BTL (MISCELLANEOUS) ×2 IMPLANT
KIT TURNOVER CYSTO (KITS) ×2 IMPLANT
NEEDLE HYPO 22GX1.5 SAFETY (NEEDLE) ×2 IMPLANT
PACK VAGINAL WOMENS (CUSTOM PROCEDURE TRAY) ×2 IMPLANT
RETRACTOR LONE STAR DISPOSABLE (INSTRUMENTS) ×2 IMPLANT
RETRACTOR STAY HOOK 5MM (MISCELLANEOUS) ×2 IMPLANT
SURGIFLO W/THROMBIN 8M KIT (HEMOSTASIS) ×1 IMPLANT
SUT PDS AB 2-0 CT2 27 (SUTURE) ×2 IMPLANT
SUT VIC AB 0 CT1 27 (SUTURE)
SUT VIC AB 0 CT1 27XBRD ANTBC (SUTURE) IMPLANT
SUT VIC AB 2-0 SH 27 (SUTURE) ×2
SUT VIC AB 2-0 SH 27XBRD (SUTURE) IMPLANT
SUT VICRYL 2-0 SH 8X27 (SUTURE) ×2 IMPLANT
SYR BULB EAR ULCER 3OZ GRN STR (SYRINGE) ×2 IMPLANT
TOWEL OR 17X26 10 PK STRL BLUE (TOWEL DISPOSABLE) ×2 IMPLANT
TRAY FOLEY W/BAG SLVR 14FR LF (SET/KITS/TRAYS/PACK) ×2 IMPLANT

## 2022-06-19 ENCOUNTER — Telehealth: Payer: Self-pay | Admitting: Obstetrics and Gynecology

## 2022-06-19 ENCOUNTER — Encounter (HOSPITAL_BASED_OUTPATIENT_CLINIC_OR_DEPARTMENT_OTHER): Payer: Self-pay | Admitting: Obstetrics and Gynecology

## 2022-06-19 NOTE — Telephone Encounter (Signed)
Natalie Carney underwent posterior repair and ileococcygeal suspension on 06/19/22.   She passed her voiding trial.  Voided  PVR by bladder scan was 0ml.   She was discharged without a catheter. Please call her for a routine post op check. Thanks!  Marguerita Beards, MD

## 2022-06-22 ENCOUNTER — Encounter: Payer: Self-pay | Admitting: *Deleted

## 2022-07-10 ENCOUNTER — Telehealth: Payer: Self-pay

## 2022-07-10 DIAGNOSIS — R110 Nausea: Secondary | ICD-10-CM

## 2022-07-10 MED ORDER — ONDANSETRON HCL 4 MG PO TABS: 4.0000 mg | ORAL_TABLET | Freq: Three times a day (TID) | ORAL | 0 refills | Status: DC | PRN

## 2022-07-10 NOTE — Telephone Encounter (Signed)
Natalie Carney is a 66 y.o. female called about having nausea and vomiting. Pt said she recently had surgery on 06/18/2022.

## 2022-07-10 NOTE — Telephone Encounter (Signed)
Feeling stomach discomfort. Having looser stools- started since the surgery but has worsened since last week. Also having some nausea. Denies constipation. Not taking any miralax or stool softener. Denies fever or chills. Has had fatigue and has been sleeping a lot. Has no sick contacts and was covid negative. Minimal vaginal bleeding. Recommended taking imodium to help with loose stools. Once bowel movements have slowed down, then can take metamucil to help thicken stools. Also recommended starting probiotic since she did have a dose of antibiotics during the surgery. Prescribed zofran for nausea. Advised patient to notify if symptoms do not improve- would consider getting stool sample for c.dif if symptoms persist.   Marguerita Beards, MD

## 2022-07-13 ENCOUNTER — Inpatient Hospital Stay (HOSPITAL_BASED_OUTPATIENT_CLINIC_OR_DEPARTMENT_OTHER): Payer: BLUE CROSS/BLUE SHIELD | Admitting: Family

## 2022-07-13 ENCOUNTER — Encounter: Payer: Self-pay | Admitting: Family

## 2022-07-13 ENCOUNTER — Other Ambulatory Visit: Payer: Self-pay

## 2022-07-13 ENCOUNTER — Inpatient Hospital Stay: Payer: BLUE CROSS/BLUE SHIELD | Attending: Hematology & Oncology

## 2022-07-13 VITALS — BP 152/51 | HR 68 | Temp 98.1°F | Resp 18 | Ht 68.0 in | Wt 267.0 lb

## 2022-07-13 DIAGNOSIS — I824Y9 Acute embolism and thrombosis of unspecified deep veins of unspecified proximal lower extremity: Secondary | ICD-10-CM

## 2022-07-13 DIAGNOSIS — I872 Venous insufficiency (chronic) (peripheral): Secondary | ICD-10-CM | POA: Insufficient documentation

## 2022-07-13 DIAGNOSIS — Z7901 Long term (current) use of anticoagulants: Secondary | ICD-10-CM | POA: Insufficient documentation

## 2022-07-13 DIAGNOSIS — I2699 Other pulmonary embolism without acute cor pulmonale: Secondary | ICD-10-CM | POA: Diagnosis not present

## 2022-07-13 LAB — CBC WITH DIFFERENTIAL (CANCER CENTER ONLY)
Abs Immature Granulocytes: 0.02 10*3/uL (ref 0.00–0.07)
Basophils Absolute: 0 10*3/uL (ref 0.0–0.1)
Basophils Relative: 0 %
Eosinophils Absolute: 0.3 10*3/uL (ref 0.0–0.5)
Eosinophils Relative: 5 %
HCT: 35.5 % — ABNORMAL LOW (ref 36.0–46.0)
Hemoglobin: 11.2 g/dL — ABNORMAL LOW (ref 12.0–15.0)
Immature Granulocytes: 0 %
Lymphocytes Relative: 44 %
Lymphs Abs: 2.9 10*3/uL (ref 0.7–4.0)
MCH: 25.6 pg — ABNORMAL LOW (ref 26.0–34.0)
MCHC: 31.5 g/dL (ref 30.0–36.0)
MCV: 81.2 fL (ref 80.0–100.0)
Monocytes Absolute: 0.4 10*3/uL (ref 0.1–1.0)
Monocytes Relative: 6 %
Neutro Abs: 3.1 10*3/uL (ref 1.7–7.7)
Neutrophils Relative %: 45 %
Platelet Count: 259 10*3/uL (ref 150–400)
RBC: 4.37 MIL/uL (ref 3.87–5.11)
RDW: 14.3 % (ref 11.5–15.5)
WBC Count: 6.8 10*3/uL (ref 4.0–10.5)
nRBC: 0 % (ref 0.0–0.2)

## 2022-07-13 LAB — CMP (CANCER CENTER ONLY)
ALT: 13 U/L (ref 0–44)
AST: 14 U/L — ABNORMAL LOW (ref 15–41)
Albumin: 3.9 g/dL (ref 3.5–5.0)
Alkaline Phosphatase: 89 U/L (ref 38–126)
Anion gap: 6 (ref 5–15)
BUN: 9 mg/dL (ref 8–23)
CO2: 30 mmol/L (ref 22–32)
Calcium: 9.1 mg/dL (ref 8.9–10.3)
Chloride: 106 mmol/L (ref 98–111)
Creatinine: 0.83 mg/dL (ref 0.44–1.00)
GFR, Estimated: >60 mL/min (ref >60)
Glucose, Bld: 131 mg/dL — ABNORMAL HIGH (ref 70–99)
Potassium: 3.8 mmol/L (ref 3.5–5.1)
Sodium: 142 mmol/L (ref 135–145)
Total Bilirubin: 0.3 mg/dL (ref 0.3–1.2)
Total Protein: 6.8 g/dL (ref 6.5–8.1)

## 2022-07-13 LAB — D-DIMER, QUANTITATIVE: D-Dimer, Quant: 0.57 ug/mL-FEU — ABNORMAL HIGH (ref 0.00–0.50)

## 2022-07-13 NOTE — Progress Notes (Signed)
Hematology and Oncology Follow Up Visit  Natalie Carney [id number] Dec 03, 1956 66 y.o. 07/13/2022   Principle Diagnosis:  History of multiple thrombotic events secondary to venous insufficiency    Past Therapy: Failed Coumadin and Xarelto - recurrence     Current Therapy:        Eliquis 5 mg PO BID    Interim History:  Natalie Carney is here today for follow-up. She is doing well and had a successful posterior repair, iliococcygeal suspension.  She has not noted any blood loss. No bruising or petechiae.  She is taking her Eliquis 5 mg PO BID as prescribed.  No fever, chills, n/v, cough, rash, dizziness, SOB, chest pain, palpitations, abdominal pain or changes in bowel or bladder habits.  She notes occasional swelling in the ball of her right foot and tenderness in the outer right calf. She does not feel that she needs an [place] at this time.  No numbness or tingling in her extremities.  No falls or syncope to report.  Appetite and hydration are good. Her weight is stable at 267 lbs.   ECOG Performance Status: 1 - Symptomatic but completely ambulatory  Medications:  Allergies as of 07/13/2022       Reactions   Nsaids    On Blood Thinners        Medication List        Accurate as of July 13, 2022  9:58 AM. If you have any questions, ask your nurse or doctor.          acetaminophen 500 MG tablet Commonly known as: TYLENOL Take 1 tablet (500 mg total) by mouth every 6 (six) hours as needed (pain).   apixaban 5 MG Tabs tablet Commonly known as: ELIQUIS Take 1 tablet (5 mg total) by mouth 2 (two) times daily.   D3 2000 50 MCG (2000 UT) Caps Generic drug: Cholecalciferol Take by mouth.   esomeprazole 40 MG capsule Commonly known as: NEXIUM Take 40 mg by mouth daily at 12 noon.   irbesartan 300 MG tablet Commonly known as: AVAPRO Take 1 tablet (300 mg total) by mouth daily.   magnesium gluconate 500 MG tablet Commonly known as: MAGONATE Take 500 mg by mouth daily.    mirabegron ER 50 MG Tb24 tablet Commonly known as: Myrbetriq Take 1 tablet (50 mg total) by mouth daily.   multivitamin-lutein Caps capsule Take 1 capsule by mouth daily.   ondansetron 4 MG tablet Commonly known as: ZOFRAN Take 1 tablet (4 mg total) by mouth every 8 (eight) hours as needed for nausea or vomiting.   ondansetron 8 MG disintegrating tablet Commonly known as: ZOFRAN-ODT Take 1 tablet (8 mg total) by mouth every 8 (eight) hours as needed for nausea.   oxyCODONE 5 MG immediate release tablet Commonly known as: Oxy IR/ROXICODONE Take 1 tablet (5 mg total) by mouth every 4 (four) hours as needed for severe pain.   polyethylene glycol powder 17 GM/SCOOP powder Commonly known as: GLYCOLAX/MIRALAX Take 17 g by mouth daily. Drink 17g (1 scoop) dissolved in water per day.   Semaglutide (1 MG/DOSE) 4 MG/3ML Sopn Inject 1 mg as directed once a week. What changed: additional instructions        Allergies:  Allergies  Allergen Reactions   Nsaids     On Blood Thinners    Past Medical History, Surgical history, Social history, and Family History were reviewed and updated.  Review of Systems: All other 10 point review of systems is negative.  Physical Exam:  vitals were not taken for this visit.   Wt Readings from Last 3 Encounters:  06/18/22 268 lb 1.6 oz (121.6 kg)  01/25/22 259 lb (117.5 kg)  01/12/22 260 lb 4 oz (118 kg)    Ocular: Sclerae unicteric, pupils equal, round and reactive to light Ear-nose-throat: Oropharynx clear, dentition fair Lymphatic: No cervical or supraclavicular adenopathy Lungs no rales or rhonchi, good excursion bilaterally Heart regular rate and rhythm, no murmur appreciated Abd soft, nontender, positive bowel sounds MSK no focal spinal tenderness, no joint edema Neuro: non-focal, well-oriented, appropriate affect Breasts: Deferred   Lab Results  Component Value Date   WBC 6.8 07/13/2022   HGB 11.2 (L) 07/13/2022   HCT 35.5  (L) 07/13/2022   MCV 81.2 07/13/2022   PLT 259 07/13/2022   No results found for: "FERRITIN", "IRON", "TIBC", "UIBC", "IRONPCTSAT" Lab Results  Component Value Date   RBC 4.37 07/13/2022   No results found for: "KPAFRELGTCHN", "LAMBDASER", "KAPLAMBRATIO" No results found for: "IGGSERUM", "IGA", "IGMSERUM" No results found for: "TOTALPROTELP", "ALBUMINELP", "A1GS", "A2GS", "BETS", "BETA2SER", "GAMS", "MSPIKE", "SPEI"   Chemistry      Component Value Date/Time   NA 142 01/12/2022 0931   K 4.2 01/12/2022 0931   CL 104 01/12/2022 0931   CO2 32 01/12/2022 0931   BUN 10 01/12/2022 0931   CREATININE 0.92 01/12/2022 0931      Component Value Date/Time   CALCIUM 9.7 01/12/2022 0931   ALKPHOS 103 01/12/2022 0931   AST 13 (L) 01/12/2022 0931   ALT 14 01/12/2022 0931   BILITOT 0.5 01/12/2022 0931       Impression and Plan: Natalie Carney is a very pleasant 66 yo African American female with history of multiple thrombotic events and venous insufficiency. Continue same regimen with Eliquis PO BID daily.  Follow-up in 6 months.   Eileen Stanford, NP 7/21/20239:58 AM

## 2022-07-13 NOTE — Addendum Note (Signed)
Addended by: Lenn Sink I on: 07/13/2022 10:34 AM   Modules accepted: Orders

## 2022-07-14 LAB — CARDIOLIPIN ANTIBODIES, IGG, IGM, IGA
Anticardiolipin IgA: <9 APL U/mL (ref 0–11)
Anticardiolipin IgG: 9 GPL U/mL (ref 0–14)
Anticardiolipin IgM: 11 MPL U/mL (ref 0–12)

## 2022-07-16 ENCOUNTER — Other Ambulatory Visit: Payer: Self-pay | Admitting: Obstetrics and Gynecology

## 2022-07-16 DIAGNOSIS — Z1231 Encounter for screening mammogram for malignant neoplasm of breast: Secondary | ICD-10-CM

## 2022-07-17 ENCOUNTER — Other Ambulatory Visit: Payer: Self-pay | Admitting: Medical-Surgical

## 2022-07-30 ENCOUNTER — Encounter: Payer: No Typology Code available for payment source | Admitting: Obstetrics and Gynecology

## 2022-08-01 ENCOUNTER — Ambulatory Visit (INDEPENDENT_AMBULATORY_CARE_PROVIDER_SITE_OTHER): Payer: No Typology Code available for payment source | Admitting: Obstetrics and Gynecology

## 2022-08-01 ENCOUNTER — Encounter: Payer: Self-pay | Admitting: Obstetrics and Gynecology

## 2022-08-01 VITALS — BP 150/82 | HR 73

## 2022-08-01 DIAGNOSIS — M62838 Other muscle spasm: Secondary | ICD-10-CM

## 2022-08-01 MED ORDER — CYCLOBENZAPRINE HCL 5 MG PO TABS: 5.0000 mg | ORAL_TABLET | Freq: Three times a day (TID) | ORAL | 1 refills | Status: DC | PRN

## 2022-08-01 NOTE — Progress Notes (Signed)
[facility]  Date of Visit: 08/01/2022  History of Present Illness: Natalie Carney is a 66 y.o. female scheduled today for a post-operative visit.   Surgery: s/p Posterior repair, Ileococcygeal suspension on 06/18/22  She passed her postoperative void trial.   Postoperative course has been uncomplicated.   Today she reports she is feeling well overall. Has been needing to take miralax the last few days for constipation. Has some pressure in her stomach. Has a pulling sensation in her left groin. She is using tylenol occasionally. Minimal vaginal bleeding. Doing well with her bladder on Myrbetriq.   UTI in the last 6 weeks? No  Pain? Yes  She has returned to her normal activity (except for postop restrictions) Vaginal bulge? No  Stress incontinence: No  Urgency/frequency: No  Urge incontinence: No  Voiding dysfunction: No  Bowel issues: Yes , some constipation  Subjective Success: Do you usually have a bulge or something falling out that you can see or feel in the vaginal area? No  Retreatment Success: Any retreatment with surgery or pessary for any compartment? No    Medications: She has a current medication list which includes the following prescription(s): acetaminophen, apixaban, d3 2000, esomeprazole, irbesartan, magnesium gluconate, mirabegron er, multivitamin-lutein, ondansetron, oxycodone, polyethylene glycol powder, and semaglutide (1 mg/dose).   Allergies: Patient is allergic to nsaids.   Physical Exam: BP (!) 150/82   Pulse 73    Pelvic Examination: Vagina: Incisions healing well. Sutures are not present at incision line and there is not granulation tissue. Tenderness of the pelvic floor muscles bilaterally. No apical tenderness. No pelvic masses.   POP-Q: POP-Q  -3                                            Aa   -3                                           Ba  -8                                              C   2.5                                             Gh  4                                            Pb  8                                            tvl   -3  Ap  -3                                            Bp                                                 D     ---------------------------------------------------------  Assessment and Plan: No diagnosis found.  - Healing well.  - Suspect she has muscular pelvic floor pain. Prescribed flexeril 5mg  to take as needed. If she does not see improvement, then can refer to pelvic PT.  - Can resume regular activity including exercise and intercourse,  if desired.  - Discussed avoidance of heavy lifting and straining long term to reduce the risk of recurrence.   Return 6 months  , MD

## 2022-08-01 NOTE — Patient Instructions (Signed)
Constipation: Our goal is to achieve formed bowel movements daily or every-other-day.  You may need to try different combinations of the following options to find what works best for you - everybody's body works differently so feel free to adjust the dosages as needed.  Some options to help maintain bowel health include:  Dietary changes (more leafy greens, vegetables and fruits; less processed foods) Fiber supplementation (Benefiber, FiberCon, Metamucil or Psyllium). Start slow and increase gradually to full dose. Over-the-counter agents such as: stool softeners (Docusate or Colace) and/or laxatives (Miralax, milk of magnesia)  "Power Pudding" is a natural mixture that may help your constipation.  To make blend 1 cup applesauce, 1 cup wheat bran, and 3/4 cup prune juice, refrigerate and then take 1 tablespoon daily with a large glass of water as needed.  

## 2022-08-02 ENCOUNTER — Ambulatory Visit (INDEPENDENT_AMBULATORY_CARE_PROVIDER_SITE_OTHER): Payer: No Typology Code available for payment source

## 2022-08-02 DIAGNOSIS — Z1231 Encounter for screening mammogram for malignant neoplasm of breast: Secondary | ICD-10-CM | POA: Diagnosis not present

## 2022-08-20 ENCOUNTER — Other Ambulatory Visit: Payer: Self-pay | Admitting: Medical-Surgical

## 2022-08-24 NOTE — Telephone Encounter (Signed)
Last office visit 11/14/2021  Future appointment 09/11/2022  Last filled 07/18/2022

## 2022-08-24 NOTE — Telephone Encounter (Signed)
Needs appointment for further refills

## 2022-09-02 ENCOUNTER — Other Ambulatory Visit: Payer: Self-pay | Admitting: Family

## 2022-09-02 DIAGNOSIS — I824Y9 Acute embolism and thrombosis of unspecified deep veins of unspecified proximal lower extremity: Secondary | ICD-10-CM

## 2022-09-02 DIAGNOSIS — I2699 Other pulmonary embolism without acute cor pulmonale: Secondary | ICD-10-CM

## 2022-09-05 NOTE — Progress Notes (Signed)
ANNUAL EXAM Patient name: Natalie Carney MRN [id number]  Date of birth: Aug 04, 1956 Chief Complaint:   Annual Exam  History of Present Illness:   Presley Gora is a 66 y.o. G1P1 female being seen today for a routine annual exam.   Current complaints: A spasm on her right side during intercourse. She does not have it following it.   She has a h/o hysterectomy but she is not sure what was removed. She had the hyst after having a myomectomy that was unsuccessful for her symptoms. She is unsure what was removed - it was done in [place] and in 2005.   No LMP recorded. Patient has had a hysterectomy.  Last pap: Unsure. H/O abnormal pap: no Last MXR: 08/03/2022  Health Maintenance Due  Topic Date Due   OPHTHALMOLOGY EXAM  Never done   HIV Screening  Never done   Diabetic kidney evaluation - Urine ACR  Never done   Hepatitis C Screening  Never done   TETANUS/TDAP  Never done   PAP SMEAR-Modifier  Never done   Zoster Vaccines- Shingrix (1 of 2) Never done   HEMOGLOBIN A1C  04/11/2022   INFLUENZA VACCINE  07/24/2022    Review of Systems:   Pertinent items are noted in HPI Denies any headaches, blurred vision, fatigue, shortness of breath, chest pain, abdominal pain, abnormal vaginal discharge/itching/odor/irritation, problems with periods, bowel movements, urination, or intercourse unless otherwise stated above.  Pertinent History Reviewed:  Reviewed past medical,surgical, social and family history.  Reviewed problem list, medications and allergies. Physical Assessment:   Vitals:   09/06/22 1050  BP: (!) 148/85  Pulse: 76  Weight: 269 lb (122 kg)  Height: _0  (1.727 m)  Body mass index is 40.9 kg/m.   Physical Examination:  General appearance - well appearing, and in no distress Mental status - alert, oriented to person, place, and time Psych:  She has a normal mood and affect Skin - warm and dry, normal color, no suspicious lesions noted Chest - effort normal Heart - normal  rate  Breasts - breasts appear normal, no suspicious masses, no skin or nipple changes or axillary nodes Abdomen - soft, nontender, nondistended, no masses or organomegaly Pelvic -  VULVA: normal appearing vulva with no masses, tenderness or lesions  VAGINA: normal appearing vagina with normal color and discharge, no lesions, bilateral levator tenderness   CERVIX: I could not see a cervix but possible that I felt one vs redundant tissue UTERUS: Surgically absent ADNEXA: None palpable Extremities:  No swelling or varicosities noted  Chaperone present for exam  No results found for this or any previous visit (from the past 24 hour(s)).  Assessment & Plan:  Natalie Carney was seen today for annual exam.  Diagnoses and all orders for this visit:  Encounter for annual routine gynecological examination -     Cytology - PAP( [facility])  - Cervical cancer screening: Discussed guidelines. Pap with HPV done since I am unsure. If negative and HPV negative, she would be a good candidate to discontinue pap smears. However, we will both work to obtain her records from [place] to see what her history is. If we cannot tell, given her [facility] of ovarian and breast cancer, we could check an [place] to see if she still has her [place] although we reviewed this is not definitive because they may also be too small to see (however this also would be reassuring. She also notes she had BRCA testing in [place] and she was  negative.  - Breast Health: Encouraged self breast awareness/SBE. Discussed limits of clinical breast exam for detecting breast cancer. Discussed importance of annual MXR. MXR is up to date: 08/03/2022 - Climacteric/Sexual health: Reviewed typical and atypical symptoms of menopause/peri-menopause. Discussed PMB and to call if any amount of spotting.  - Colonoscopy:  2021 - F/U 12 months and prn  No orders of the defined types were placed in this encounter.   Meds: No orders of the defined types were placed in this  encounter.   Follow-up: No follow-ups on file.  Radene Gunning, MD 09/06/2022 11:48 AM

## 2022-09-06 ENCOUNTER — Ambulatory Visit (INDEPENDENT_AMBULATORY_CARE_PROVIDER_SITE_OTHER): Payer: No Typology Code available for payment source

## 2022-09-06 ENCOUNTER — Ambulatory Visit
Admission: RE | Admit: 2022-09-06 | Discharge: 2022-09-06 | Disposition: A | Discharge status: Home / Self Care | Payer: BLUE CROSS/BLUE SHIELD | Source: Ambulatory Visit | Attending: Family Medicine | Admitting: Family Medicine

## 2022-09-06 ENCOUNTER — Encounter: Payer: Self-pay | Admitting: Obstetrics and Gynecology

## 2022-09-06 ENCOUNTER — Other Ambulatory Visit (HOSPITAL_COMMUNITY)
Admission: RE | Admit: 2022-09-06 | Discharge: 2022-09-06 | Disposition: A | Discharge status: Home / Self Care | Payer: BLUE CROSS/BLUE SHIELD | Source: Ambulatory Visit | Attending: Obstetrics and Gynecology | Admitting: Obstetrics and Gynecology

## 2022-09-06 ENCOUNTER — Ambulatory Visit (INDEPENDENT_AMBULATORY_CARE_PROVIDER_SITE_OTHER): Payer: No Typology Code available for payment source | Admitting: Obstetrics and Gynecology

## 2022-09-06 VITALS — BP 147/83 | HR 75 | Temp 97.5°F | Resp 20 | Ht 68.0 in | Wt 269.0 lb

## 2022-09-06 VITALS — BP 148/85 | HR 76 | Ht 68.0 in | Wt 269.0 lb

## 2022-09-06 DIAGNOSIS — M25531 Pain in right wrist: Secondary | ICD-10-CM | POA: Diagnosis not present

## 2022-09-06 DIAGNOSIS — Z01419 Encounter for gynecological examination (general) (routine) without abnormal findings: Secondary | ICD-10-CM

## 2022-09-06 DIAGNOSIS — M79641 Pain in right hand: Secondary | ICD-10-CM

## 2022-09-06 NOTE — ED Provider Notes (Signed)
[facility]    CSN: [id number] Arrival date & time: 09/06/22  1446      History   Chief Complaint Chief Complaint  Patient presents with   Wrist Pain    HPI Natalie Carney is a 66 y.o. female.   HPI 66 year old female presents with right wrist pain for 2 days secondary to injury.  Patient reports while opening a sliding glass door on Tuesday evening roughly 5:30 PM she heard a pop in her right wrist while opening door.  PMH significant for obesity, HTN, and T2DM without complication.  Past Medical History:  Diagnosis Date   Abnormal mammogram    Arthritis    both knees   Colon polyp    Complication of anesthesia    DVT (deep venous thrombosis) (HCC)    several dvt's last dvt was 3 to 4 yrs ago per pt on 06-14-2022   GERD (gastroesophageal reflux disease)    Hypertension    Lymphedema    both legs uses compression machine at home and wears compression hose   PONV (postoperative nausea and vomiting)    post op ponv after myometcomy yrs ago   Posterior vaginal wall prolapse    Pulmonary embolism (HCC)    yrs ago   Type 2 Diabetes mellitus without complication [facility])     Patient Active Problem List   Diagnosis Date Noted   Hematuria 04/03/2022   Urge incontinence 04/03/2022   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   Chronic anticoagulation 04/11/2021   Essential hypertension 04/11/2021   Controlled type 2 diabetes mellitus with complication, without long-term current use of insulin (HCC) 04/11/2021   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   Venous (peripheral) insufficiency 03/14/2021    Past Surgical History:  Procedure Laterality Date   ABDOMINAL HYSTERECTOMY  2005   KNEE SURGERY Right    arthroscopic right knee   LAPAROSCOPIC GASTRIC BANDING     MYOMECTOMY  2003   RECTOCELE REPAIR N/A 06/18/2022   Procedure: POSTERIOR REPAIR (RECTOCELE), ILLIOCOCCYGEAL SUSPENSION;  Surgeon: Marguerita Beards, MD;  Location[facility];   Service: Gynecology;  Laterality: N/A;   SHOULDER SURGERY Left    x 2 yrs ago    OB History     Gravida  1   Para  1   Term      Preterm      AB      Living  1      SAB      IAB      Ectopic      Multiple      Live Births  1            Home Medications    Prior to Admission medications   Medication Sig Start Date End Date Taking? Authorizing Provider  acetaminophen (TYLENOL) 500 MG tablet Take 1 tablet (500 mg total) by mouth every 6 (six) hours as needed (pain). 05/29/22   Marguerita Beards, MD  Cholecalciferol (D3 2000) 50 MCG (2000 UT) CAPS Take by mouth.    [provider]  cyclobenzaprine (FLEXERIL) 5 MG tablet Take 1 tablet (5 mg total) by mouth 3 (three) times daily as needed for muscle spasms. 08/01/22   Marguerita Beards, MD  ELIQUIS 5 MG TABS tablet TAKE 1 TABLET(5 MG) BY MOUTH TWICE DAILY 09/03/22   Josph Macho, MD  esomeprazole (NEXIUM) 40 MG capsule Take 40 mg by mouth daily at 12 noon.    [provider]  irbesartan (  AVAPRO) 300 MG tablet TAKE 1 TABLET(300 MG) BY MOUTH DAILY 08/24/22   Christen Butter, NP  magnesium gluconate (MAGONATE) 500 MG tablet Take 500 mg by mouth daily.    [provider]  mirabegron ER (MYRBETRIQ) 50 MG TB24 tablet Take 1 tablet (50 mg total) by mouth daily. 04/06/22   Marguerita Beards, MD  multivitamin-lutein [facility]) CAPS capsule Take 1 capsule by mouth daily.    [provider]  ondansetron (ZOFRAN) 4 MG tablet Take 1 tablet (4 mg total) by mouth every 8 (eight) hours as needed for nausea or vomiting. 07/10/22   Marguerita Beards, MD  Semaglutide, 1 MG/DOSE, 4 MG/3ML SOPN Inject 1 mg as directed once a week. Patient taking differently: Inject 1 mg as directed once a week. On mondays 10/11/21   Christen Butter, NP    Family History Family History  Problem Relation Age of Onset   Pneumonia Mother    Diabetes Father    Hypertension Father    Heart attack Father     Cervical cancer Maternal Grandmother    Breast cancer Maternal Aunt    Breast cancer Other        cousin   Ovarian cancer Other        m. grandmother    Social History Social History   Tobacco Use   Smoking status: Former    Types: Cigarettes    Quit date: 12/25/1987    Years since quitting: 34.7   Smokeless tobacco: Never   Tobacco comments:    Social smoker quit 1989  Vaping Use   Vaping Use: Never used  Substance Use Topics   Alcohol use: Never   Drug use: Never     Allergies   Nsaids   Review of Systems Review of Systems  Musculoskeletal:        Right wrist/right hand pain for 2 days secondary to injuring it while opening a sliding glass door Tuesday, 09/04/2022 at 5:30 PM  All other systems reviewed and are negative.    Physical Exam Triage Vital Signs ED Triage Vitals  Enc Vitals Group     BP 09/06/22 1520 (!) 147/83     Pulse Rate 09/06/22 1520 75     Resp 09/06/22 1520 20     Temp 09/06/22 1520 (!) 97.5 F (36.4 C)     Temp Source 09/06/22 1520 Oral     SpO2 09/06/22 1520 99 %     Weight 09/06/22 1517 269 lb (122 kg)     Height 09/06/22 1517 5\' 8"  (1.727 m)     Head Circumference --      Peak Flow --      Pain Score 09/06/22 1517 8     Pain Loc --      Pain Edu? --      Excl. in GC? --    No data found.  Updated Vital Signs BP (!) 147/83 (BP Location: Right Arm)   Pulse 75   Temp (!) 97.5 F (36.4 C) (Oral)   Resp 20   Ht 5\' 8"  (1.727 m)   Wt 269 lb (122 kg)   SpO2 99%   BMI 40.90 kg/m      Physical Exam Vitals and nursing note reviewed.  Constitutional:      General: She is not in acute distress.    Appearance: Normal appearance. She is normal weight. She is not ill-appearing.  HENT:     Head: Normocephalic and atraumatic.     Mouth/Throat:  Mouth: Mucous membranes are moist.     Pharynx: Oropharynx is clear.  Eyes:     Extraocular Movements: Extraocular movements intact.     Conjunctiva/sclera: Conjunctivae normal.      Pupils: Pupils are equal, round, and reactive to light.  Cardiovascular:     Rate and Rhythm: Normal rate and regular rhythm.     Pulses: Normal pulses.     Heart sounds: Normal heart sounds. No murmur heard. Pulmonary:     Effort: Pulmonary effort is normal.     Breath sounds: Normal breath sounds. No wheezing, rhonchi or rales.  Musculoskeletal:        General: Normal range of motion.     Cervical back: Normal range of motion and neck supple.     Comments: Right wrist (anterior aspect): TTP over distal ulna/CMC, FROM with flexion/extension/ulnar deviation  Skin:    General: Skin is warm and dry.  Neurological:     General: No focal deficit present.     Mental Status: She is alert and oriented to person, place, and time.      UC Treatments / Results  Labs (all labs ordered are listed, but only abnormal results are displayed) Labs Reviewed - No data to display  EKG   Radiology DG Hand Complete Right  Result Date: 09/06/2022 CLINICAL DATA:  Wrist pain for 2 days EXAM: RIGHT HAND - COMPLETE 3+ VIEW COMPARISON:  08/26/2021 FINDINGS: No fracture or malalignment. Moderate degenerative change at the first [facility] joint. Joint space narrowing at the PIP and D IP joints with chronic erosions at the second and third D IP joints. Minimal joint space narrowing and degenerative change at the MCP joints. IMPRESSION: 1. No acute osseous abnormality. 2. Arthritis as above with erosive change most evident at the second and third D IP joints, consistent with inflammatory arthropathy. Electronically Signed   By: Jasmine Pang M.D.   On: 09/06/2022 15:51   DG Wrist Complete Right  Result Date: 09/06/2022 CLINICAL DATA:  Wrist pain EXAM: RIGHT WRIST - COMPLETE 3+ VIEW COMPARISON:  None Available. FINDINGS: No fracture or malalignment. Moderate degenerative change at the first [facility] joint. Degenerative cysts in the ulna. IMPRESSION: 1. No acute osseous abnormality. 2. Arthritis at the first [facility] joint  Electronically Signed   By: Jasmine Pang M.D.   On: 09/06/2022 15:48    Procedures Procedures (including critical care time)  Medications Ordered in UC Medications - No data to display  Initial Impression / Assessment and Plan / UC Course  I have reviewed the triage vital signs and the nursing notes.  Pertinent labs & imaging results that were available during my care of the patient were reviewed by me and considered in my medical decision making (see chart for details).     MDM: 1.  Right wrist-right wrist x-ray reveals above; 2.  Right hand pain-right hand x-ray reveals above. Advised patient of the right wrist/right hand x-ray results with hard copy provided to patient.  Advised patient to RICE affected area of right wrist for 20 to 30 minutes 3 times daily for the next 3 days.  Advised if symptoms worsen and/or unresolved please follow-up with [place] orthopedic/sports medicine provider for further evaluation.  Contact information is below.  Patient discharged home, hemodynamically stable. Final Clinical Impressions(s) / UC Diagnoses   Final diagnoses:  Right wrist pain  Right hand pain     Discharge Instructions      Advised patient of the right wrist/right hand x-ray  results with hard copy provided to patient.  Advised patient to RICE affected area of right wrist for 20 to 30 minutes 3 times daily for the next 3 days.  Advised if symptoms worsen and/or unresolved please follow-up with [place] orthopedic/sports medicine provider for further evaluation.  Contact information is below.     ED Prescriptions   None    PDMP not reviewed this encounter.   Trevor Iha, FNP 09/06/22 1622

## 2022-09-06 NOTE — ED Triage Notes (Signed)
Pt presents to Urgent Care with c/o R lateral wrist, hand, and forearm pain after injury today. States she was opening a sliding glass door w/ a heavy load of groceries in her hands, and she felt pain and a popping sensation in this area.

## 2022-09-06 NOTE — Discharge Instructions (Addendum)
Advised patient of the right wrist/right hand x-ray results with hard copy provided to patient.  Advised patient to RICE affected area of right wrist for 20 to 30 minutes 3 times daily for the next 3 days.  Advised if symptoms worsen and/or unresolved please follow-up with [place] orthopedic/sports medicine provider for further evaluation.  Contact information is below.

## 2022-09-11 ENCOUNTER — Encounter: Payer: Self-pay | Admitting: Medical-Surgical

## 2022-09-11 ENCOUNTER — Ambulatory Visit (INDEPENDENT_AMBULATORY_CARE_PROVIDER_SITE_OTHER): Payer: No Typology Code available for payment source | Admitting: Medical-Surgical

## 2022-09-11 VITALS — BP 115/72 | HR 93 | Resp 20 | Ht 68.0 in | Wt 269.6 lb

## 2022-09-11 DIAGNOSIS — Z1329 Encounter for screening for other suspected endocrine disorder: Secondary | ICD-10-CM | POA: Diagnosis not present

## 2022-09-11 DIAGNOSIS — E118 Type 2 diabetes mellitus with unspecified complications: Secondary | ICD-10-CM

## 2022-09-11 DIAGNOSIS — Z23 Encounter for immunization: Secondary | ICD-10-CM | POA: Diagnosis not present

## 2022-09-11 DIAGNOSIS — M8580 Other specified disorders of bone density and structure, unspecified site: Secondary | ICD-10-CM

## 2022-09-11 DIAGNOSIS — I10 Essential (primary) hypertension: Secondary | ICD-10-CM | POA: Diagnosis not present

## 2022-09-11 LAB — CYTOLOGY - PAP
Comment: NEGATIVE
Diagnosis: NEGATIVE
High risk HPV: NEGATIVE

## 2022-09-11 MED ORDER — IRBESARTAN 300 MG PO TABS: ORAL_TABLET | ORAL | 3 refills | Status: DC

## 2022-09-11 MED ORDER — SEMAGLUTIDE (1 MG/DOSE) 4 MG/3ML ~~LOC~~ SOPN: 1.0000 mg | PEN_INJECTOR | SUBCUTANEOUS | 3 refills | Status: DC

## 2022-09-11 NOTE — Progress Notes (Signed)
Complete physical exam  Patient: Natalie Carney   DOB: [birth year]   66 y.o. Female  MRN: [id number]  Subjective:    Chief Complaint  Patient presents with   Annual Exam    Natalie Carney is a 66 y.o. female who presents today for a complete physical exam. She reports consuming a general diet. Home exercise routine includes playing basketball, line dancing, walking. She generally feels well. She reports sleeping well. She does not have additional problems to discuss today.    Most recent fall risk assessment:    04/11/2021    1:23 PM  Fall Risk   Falls in the past year? 0  Number falls in past yr: 0  Injury with Fall? 0  Follow up Falls evaluation completed     Most recent depression screenings:    09/11/2022    3:31 PM 04/11/2021    1:23 PM  PHQ 2/9 Scores  PHQ - 2 Score 0 0  PHQ- 9 Score  1    Vision:Not within last year , Dental: No current dental problems and Receives regular dental care, and STD: The patient denies history of sexually transmitted disease.    Patient Care Team: Christen Butter, NP as PCP - General (Nurse Practitioner)   Outpatient Medications Prior to Visit  Medication Sig   acetaminophen (TYLENOL) 500 MG tablet Take 1 tablet (500 mg total) by mouth every 6 (six) hours as needed (pain).   Cholecalciferol (D3 2000) 50 MCG (2000 UT) CAPS Take by mouth.   ELIQUIS 5 MG TABS tablet TAKE 1 TABLET(5 MG) BY MOUTH TWICE DAILY   esomeprazole (NEXIUM) 40 MG capsule Take 40 mg by mouth daily at 12 noon.   magnesium gluconate (MAGONATE) 500 MG tablet Take 500 mg by mouth daily.   mirabegron ER (MYRBETRIQ) 50 MG TB24 tablet Take 1 tablet (50 mg total) by mouth daily.   multivitamin-lutein (OCUVITE-LUTEIN) CAPS capsule Take 1 capsule by mouth daily.   ondansetron (ZOFRAN) 4 MG tablet Take 1 tablet (4 mg total) by mouth every 8 (eight) hours as needed for nausea or vomiting.   [DISCONTINUED] irbesartan (AVAPRO) 300 MG tablet TAKE 1 TABLET(300 MG) BY MOUTH DAILY    [DISCONTINUED] Semaglutide, 1 MG/DOSE, 4 MG/3ML SOPN Inject 1 mg as directed once a week. (Patient taking differently: Inject 1 mg as directed once a week. On mondays)   [DISCONTINUED] cyclobenzaprine (FLEXERIL) 5 MG tablet Take 1 tablet (5 mg total) by mouth 3 (three) times daily as needed for muscle spasms. (Patient not taking: Reported on 09/11/2022)   No facility-administered medications prior to visit.   Review of Systems  Constitutional:  Negative for chills, fever, malaise/fatigue and weight loss.  HENT:  Negative for congestion, ear pain, hearing loss, sinus pain and sore throat.   Eyes:  Negative for blurred vision, photophobia and pain.  Respiratory:  Negative for cough, shortness of breath and wheezing.   Cardiovascular:  Negative for chest pain, palpitations and leg swelling.  Gastrointestinal:  Negative for abdominal pain, constipation, diarrhea, heartburn, nausea and vomiting.  Genitourinary:  Negative for dysuria, frequency and urgency.  Musculoskeletal:  Positive for joint pain (right wrist, right hand). Negative for falls and neck pain.  Skin:  Negative for itching and rash.  Neurological:  Negative for dizziness, weakness and headaches.  Endo/Heme/Allergies:  Negative for polydipsia. Does not bruise/bleed easily.  Psychiatric/Behavioral:  Negative for depression, substance abuse and suicidal ideas. The patient is not nervous/anxious and does not have insomnia.  Objective:    BP 115/72   Pulse 93   Resp 20   Ht 5\' 8"  (1.727 m)   Wt 269 lb 9.6 oz (122.3 kg)   SpO2 99%   BMI 40.99 kg/m    Physical Exam Constitutional:      General: She is not in acute distress.    Appearance: Normal appearance. She is not ill-appearing.  HENT:     Head: Normocephalic and atraumatic.     Right Ear: Tympanic membrane normal.     Left Ear: Tympanic membrane normal.     Nose: Nose normal.     Mouth/Throat:     Mouth: Mucous membranes are moist.     Pharynx: No oropharyngeal  exudate or posterior oropharyngeal erythema.  Eyes:     Extraocular Movements: Extraocular movements intact.     Conjunctiva/sclera: Conjunctivae normal.     Pupils: Pupils are equal, round, and reactive to light.  Neck:     Thyroid: No thyromegaly.     Vascular: No carotid bruit or JVD.     Trachea: Trachea normal.  Cardiovascular:     Rate and Rhythm: Normal rate and regular rhythm.     Pulses: Normal pulses.     Heart sounds: Normal heart sounds. No murmur heard.    No friction rub. No gallop.  Pulmonary:     Effort: Pulmonary effort is normal. No respiratory distress.     Breath sounds: Normal breath sounds. No wheezing.  Abdominal:     General: Bowel sounds are normal. There is no distension.     Palpations: Abdomen is soft.     Tenderness: There is no abdominal tenderness. There is no guarding.  Musculoskeletal:     Right wrist: Tenderness present.     Right hand: Decreased range of motion.     Cervical back: Normal range of motion and neck supple.  Skin:    General: Skin is warm and dry.  Neurological:     Mental Status: She is alert and oriented to person, place, and time.     Cranial Nerves: No cranial nerve deficit.  Psychiatric:        Mood and Affect: Mood normal.        Behavior: Behavior normal.        Thought Content: Thought content normal.        Judgment: Judgment normal.    No results found for any visits on 09/11/22.     Assessment & Plan:    Routine Health Maintenance and Physical Exam  Immunization History  Administered Date(s) Administered   Influenza,inj,Quad PF,6+ Mos 10/11/2021, 09/11/2022   Influenza-Unspecified 09/23/2020    Health Maintenance  Topic Date Due   OPHTHALMOLOGY EXAM  Never done   HIV Screening  Never done   Diabetic kidney evaluation - Urine ACR  Never done   Hepatitis C Screening  Never done   HEMOGLOBIN A1C  04/11/2022   COLONOSCOPY (Pts 45-60yrs Insurance coverage will need to be confirmed)  11/14/2022 (Originally  02/08/2003)   Zoster Vaccines- Shingrix (1 of 2) 12/11/2022 (Originally 02/09/2008)   TETANUS/TDAP  09/12/2023 (Originally 02/08/1977)   FOOT EXAM  10/11/2022   Diabetic kidney evaluation - GFR measurement  07/14/2023   MAMMOGRAM  08/02/2024   PAP SMEAR-Modifier  09/06/2025   INFLUENZA VACCINE  Completed   HPV VACCINES  Aged Out   COVID-19 Vaccine  Discontinued    Discussed health benefits of physical activity, and encouraged her to engage in regular exercise appropriate for  her age and condition.  1. Need for influenza vaccination Flu vaccine given in office. - Flu Vaccine QUAD 6+ mos PF IM (Fluarix Quad PF)  2. Controlled type 2 diabetes mellitus with complication, without long-term current use of insulin (HCC) Checking A1c. Continue Ozempic 1mg  weekly. Check sugars regularly and work on following a diabetic diet. Recommend update on her diabetic eye exam. Does not have an eye doctor but has an appointment scheduled, not until March 2024. Recommend she reach out to [facility] in [place] as they may have more availability and may be able to get her in sooner.  - Hemoglobin A1c  3. Essential hypertension Checking labs. Continue irbesartan 300mg  daily. Refills sent. Monitor BP at home with a goal of 130/80 or less. Low sodium diet and regular intentional exercise recommended.  - Lipid panel - COMPLETE METABOLIC PANEL WITH GFR - CBC with Differential/Platelet  4. Thyroid disorder screen Checking TSH.  - TSH  5. Osteopenia, unspecified location DEXA scan ordered.  - DG Bone Density; Future  6. Right wrist pain 7. Right hand pain Reviewed recommendations for conservative measures for at least 6 weeks. Unable to take NSAIDs. Discussed the option to treat with a burst of steroids but patient declined. Discussed bracing, especially at night but also during the day if her job is physically demanding at the areas of concern. Reviewed exercises and stretches that may be beneficial for  both her wrist as well as her hands.   Return in about 6 months (around 03/12/2023) for DM/HTN/HLD follow up.   Samuel Bouche, NP

## 2022-09-11 NOTE — Patient Instructions (Addendum)
[place] [phone]

## 2022-09-17 LAB — COMPLETE METABOLIC PANEL WITH GFR
AG Ratio: 1.3 (calc) (ref 1.0–2.5)
ALT: 14 U/L (ref 6–29)
AST: 14 U/L (ref 10–35)
Albumin: 3.7 g/dL (ref 3.6–5.1)
Alkaline phosphatase (APISO): 98 U/L (ref 37–153)
BUN: 11 mg/dL (ref 7–25)
CO2: 27 mmol/L (ref 20–32)
Calcium: 8.8 mg/dL (ref 8.6–10.4)
Chloride: 108 mmol/L (ref 98–110)
Creat: 0.79 mg/dL (ref 0.50–1.05)
Globulin: 2.9 g/dL (calc) (ref 1.9–3.7)
Glucose, Bld: 96 mg/dL (ref 65–99)
Potassium: 4.1 mmol/L (ref 3.5–5.3)
Sodium: 142 mmol/L (ref 135–146)
Total Bilirubin: 0.5 mg/dL (ref 0.2–1.2)
Total Protein: 6.6 g/dL (ref 6.1–8.1)
eGFR: 83 mL/min/{1.73_m2} (ref >=60)

## 2022-09-17 LAB — TEST AUTHORIZATION

## 2022-09-17 LAB — CBC WITH DIFFERENTIAL/PLATELET
Absolute Monocytes: 422 cells/uL (ref 200–950)
Basophils Absolute: 41 cells/uL (ref 0–200)
Basophils Relative: 0.6 %
Eosinophils Absolute: 197 cells/uL (ref 15–500)
Eosinophils Relative: 2.9 %
HCT: 37.1 % (ref 35.0–45.0)
Hemoglobin: 12.2 g/dL (ref 11.7–15.5)
Lymphs Abs: 2604 cells/uL (ref 850–3900)
MCH: 25.9 pg — ABNORMAL LOW (ref 27.0–33.0)
MCHC: 32.9 g/dL (ref 32.0–36.0)
MCV: 78.8 fL — ABNORMAL LOW (ref 80.0–100.0)
MPV: 9 fL (ref 7.5–12.5)
Monocytes Relative: 6.2 %
Neutro Abs: 3536 cells/uL (ref 1500–7800)
Neutrophils Relative %: 52 %
Platelets: 273 10*3/uL (ref 140–400)
RBC: 4.71 10*6/uL (ref 3.80–5.10)
RDW: 14.3 % (ref 11.0–15.0)
Total Lymphocyte: 38.3 %
WBC: 6.8 10*3/uL (ref 3.8–10.8)

## 2022-09-17 LAB — LIPID PANEL
Cholesterol: 169 mg/dL (ref <200)
HDL: 35 mg/dL — ABNORMAL LOW (ref >=50)
LDL Cholesterol (Calc): 114 mg/dL (calc) — ABNORMAL HIGH
Non-HDL Cholesterol (Calc): 134 mg/dL (calc) — ABNORMAL HIGH (ref <130)
Total CHOL/HDL Ratio: 4.8 (calc) (ref <5.0)
Triglycerides: 98 mg/dL (ref <150)

## 2022-09-17 LAB — TSH: TSH: 2.25 mIU/L (ref 0.40–4.50)

## 2022-09-17 LAB — IRON,TIBC AND FERRITIN PANEL
%SAT: 16 % (calc) (ref 16–45)
Ferritin: 21 ng/mL (ref 16–288)
Iron: 57 ug/dL (ref 45–160)
TIBC: 355 mcg/dL (calc) (ref 250–450)

## 2022-09-17 LAB — HEMOGLOBIN A1C
Hgb A1c MFr Bld: 6.1 % of total Hgb — ABNORMAL HIGH (ref <5.7)
Mean Plasma Glucose: 128 mg/dL
eAG (mmol/L): 7.1 mmol/L

## 2022-10-03 ENCOUNTER — Ambulatory Visit (INDEPENDENT_AMBULATORY_CARE_PROVIDER_SITE_OTHER): Payer: No Typology Code available for payment source

## 2022-10-03 DIAGNOSIS — M8580 Other specified disorders of bone density and structure, unspecified site: Secondary | ICD-10-CM

## 2022-10-14 ENCOUNTER — Other Ambulatory Visit: Payer: Self-pay | Admitting: Medical-Surgical

## 2022-10-15 ENCOUNTER — Encounter: Payer: Self-pay | Admitting: *Deleted

## 2022-11-21 ENCOUNTER — Other Ambulatory Visit: Payer: Self-pay

## 2022-11-21 DIAGNOSIS — I2699 Other pulmonary embolism without acute cor pulmonale: Secondary | ICD-10-CM

## 2022-11-21 DIAGNOSIS — I824Y9 Acute embolism and thrombosis of unspecified deep veins of unspecified proximal lower extremity: Secondary | ICD-10-CM

## 2022-11-21 MED ORDER — APIXABAN 5 MG PO TABS: ORAL_TABLET | ORAL | 0 refills | Status: DC

## 2023-01-08 ENCOUNTER — Other Ambulatory Visit: Payer: Self-pay

## 2023-01-08 DIAGNOSIS — I824Y9 Acute embolism and thrombosis of unspecified deep veins of unspecified proximal lower extremity: Secondary | ICD-10-CM

## 2023-01-08 DIAGNOSIS — I2699 Other pulmonary embolism without acute cor pulmonale: Secondary | ICD-10-CM

## 2023-01-08 MED ORDER — APIXABAN 5 MG PO TABS: ORAL_TABLET | ORAL | 0 refills | Status: DC

## 2023-01-09 LAB — HM DIABETES EYE EXAM

## 2023-01-21 ENCOUNTER — Encounter: Payer: Self-pay | Admitting: Medical-Surgical

## 2023-02-01 ENCOUNTER — Encounter: Payer: Self-pay | Admitting: Obstetrics and Gynecology

## 2023-02-01 ENCOUNTER — Ambulatory Visit (INDEPENDENT_AMBULATORY_CARE_PROVIDER_SITE_OTHER): Payer: Medicare Other | Admitting: Obstetrics and Gynecology

## 2023-02-01 DIAGNOSIS — N3941 Urge incontinence: Secondary | ICD-10-CM

## 2023-02-01 MED ORDER — MIRABEGRON ER 50 MG PO TB24: 50.0000 mg | ORAL_TABLET | Freq: Every day | ORAL | 3 refills | Status: DC

## 2023-02-01 NOTE — Progress Notes (Signed)
[facility]  Date of Visit: 02/01/2023  History of Present Illness: Natalie Carney is a 67 y.o. female presenting today for follow up of OAB.   Surgery: s/p Posterior repair, Ileococcygeal suspension on 06/18/22  Has been taking Myrbetriq 66m daily. Has occasional leakage with urgency. Not happening every day. Mostly drinks water and soda. But overall feels that the medication is working well.   Sometimes has bowel urgency, recently has harder balls of stool and occasionally looser stools. Denies bowel leakage. Takes benefiber but not every day. Denies bulge in the vagina or any vaginal pain.   She does have occasional localized pelvic pain on the right side. Feels it might be some scar tissue.    Medications: She has a current medication list which includes the following prescription(s): acetaminophen, apixaban, d3 2000, esomeprazole, irbesartan, magnesium gluconate, multivitamin-lutein, ondansetron, semaglutide (1 mg/dose), and mirabegron er.   Allergies: Patient is allergic to nsaids.   Physical Exam: BP 131/84   Pulse 80    Gen: AAOx 3  POP-Q (08/01/22): POP-Q   -3                                            Aa   -3                                           Ba   -8                                              C    2.5                                            Gh   4                                            Pb   8                                            tvl    -3                                            Ap   -3                                            Bp  D        ---------------------------------------------------------  Assessment and Plan:  1. Urge incontinence    - refill provided for Myrbetriq 70m daily.  - For bowel urgency, recommended fiber supplement to prevent looser stools. If looser stools are ongoing, should follow up with PCP or GI for further evaluation.  - offered pelvic PT  for pelvic pain and scar massage, she declines at this time - Follow up 1 year or sooner if needed  MJaquita Folds MD  Time spent: I spent 20 minutes dedicated to the care of this patient on the date of this encounter to include pre-visit review of records, face-to-face time with the patient and post visit documentation and ordering medication/ testing.

## 2023-02-05 ENCOUNTER — Telehealth: Payer: Self-pay

## 2023-02-05 NOTE — Telephone Encounter (Signed)
LMVM that she can stop by the office to pick up 2 boxes at her convenience. She will have to take two doses weekly to equal 58m.

## 2023-02-05 NOTE — Telephone Encounter (Signed)
Patient called to let you know that her insurance has lapsed and she can't afford her Semaglutide until next month.  She is wanting advise on what she needs to do in the mean time.

## 2023-02-11 ENCOUNTER — Encounter: Payer: Self-pay | Admitting: *Deleted

## 2023-03-01 ENCOUNTER — Telehealth: Payer: Self-pay

## 2023-03-01 NOTE — Telephone Encounter (Signed)
Patient called with another dilemma with her insurance. She has silver script and won't receive the card for 4 - 6 weeks and she only has 1 week left of her Ozempic.   She's wanting to know if you can help her with samples.

## 2023-03-01 NOTE — Telephone Encounter (Signed)
If available, we can give her 2 boxes of samples however this is 0.5 mg so she will have to take 2 injections each week.

## 2023-03-04 NOTE — Telephone Encounter (Signed)
Contacted patient and advised her that we can give her 2 samples of Ozempic and that she will have to take 2 injections each week. The patient will come by the office today to pick up the 2 boxes that I put aside for her in the refrigerator.

## 2023-03-05 NOTE — Telephone Encounter (Signed)
Patient picked up 03/04/2023

## 2023-03-10 IMAGING — CT CT ABD-PEL WO/W CM
3 of 12 series · 12 of 46 positions shown, 17 images · IV contrast (APPLIED)
Comparison: None.

CLINICAL DATA: Hematuria for several hysterectomy

EXAM:
CT ABDOMEN AND PELVIS WITHOUT AND WITH CONTRAST
TECHNIQUE: Multidetector CT imaging of the abdomen and pelvis was performed
following the standard protocol before and following the bolus
administration of intravenous contrast.
CONTRAST:  125mL OMNIPAQUE IOHEXOL 300 MG/ML  SOLN

[Series 4: coronal pre · coronal · non-contrast · 0.64mm/px · 2 of 87 slices shown, 3 images]
[im 29/87  soft-tissue]
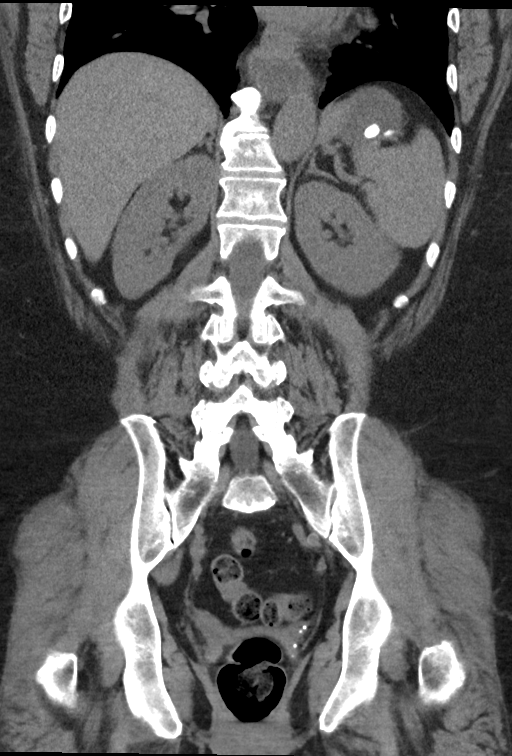
[im 29/87  bone]
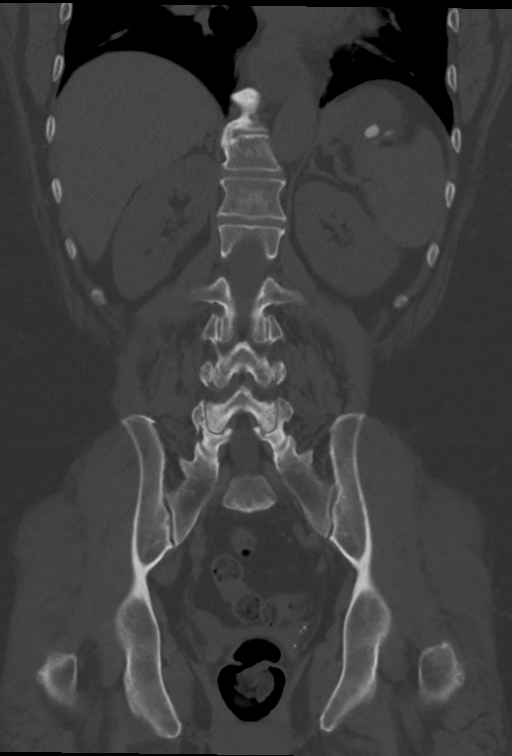
[im 58/87  soft-tissue]
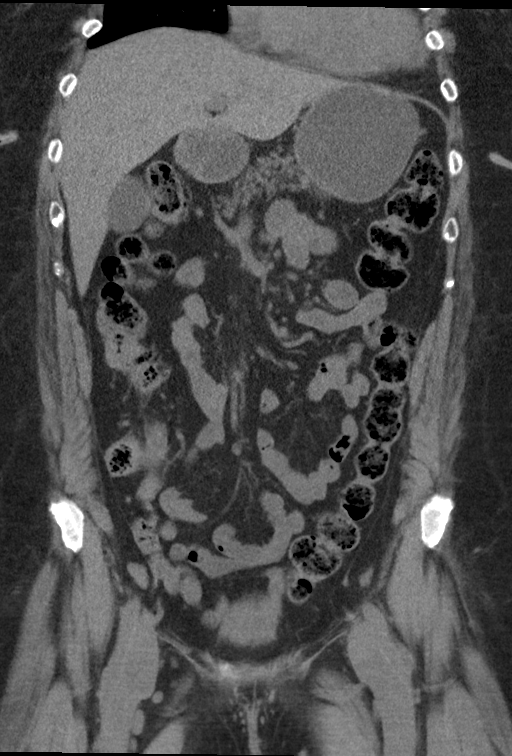

[Series 13: axial delay · axial · delayed · 0.78mm/px · z∈[-432,-157]mm · 3 of 111 slices shown, 7 images]
[im 28/111  soft-tissue]
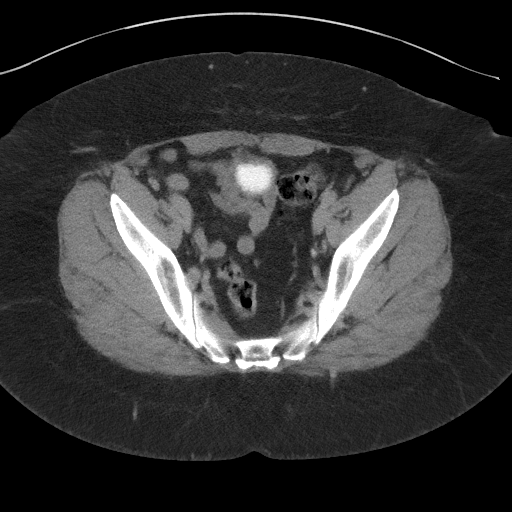
[im 28/111  lung]
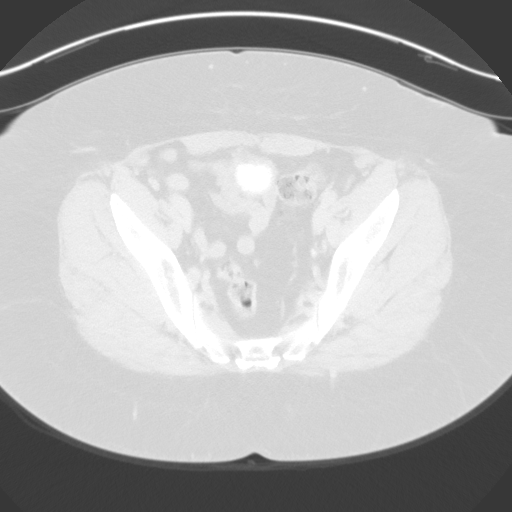
[im 28/111  bone]
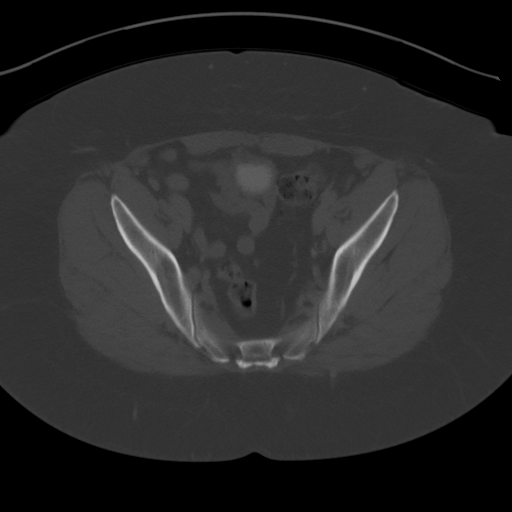
[im 56/111  soft-tissue]
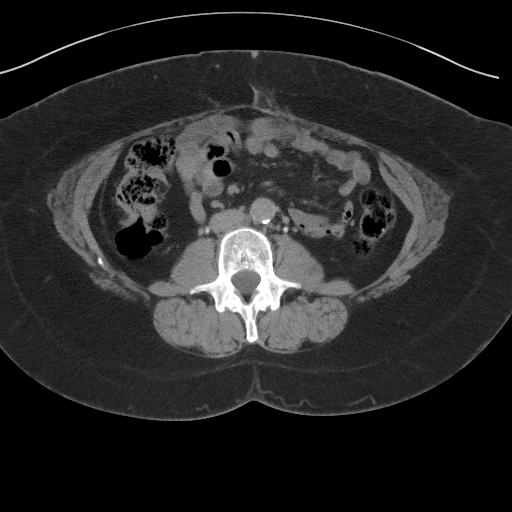
[im 56/111  lung]
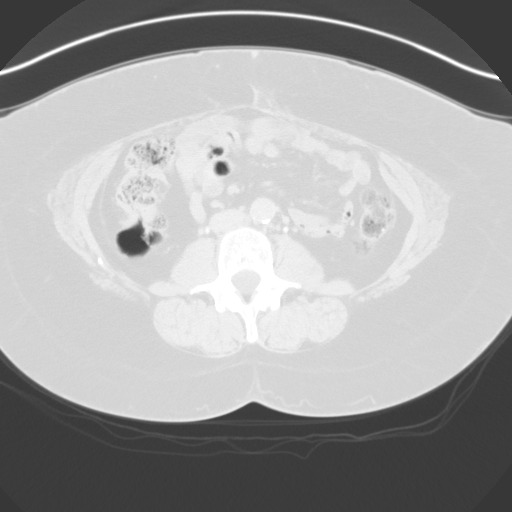
[im 83/111  soft-tissue]
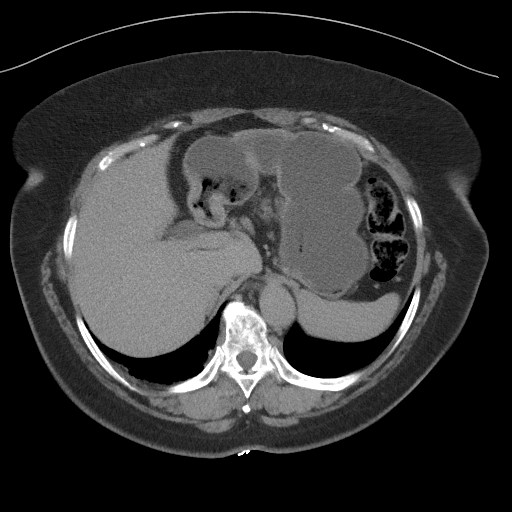
[im 83/111  lung]
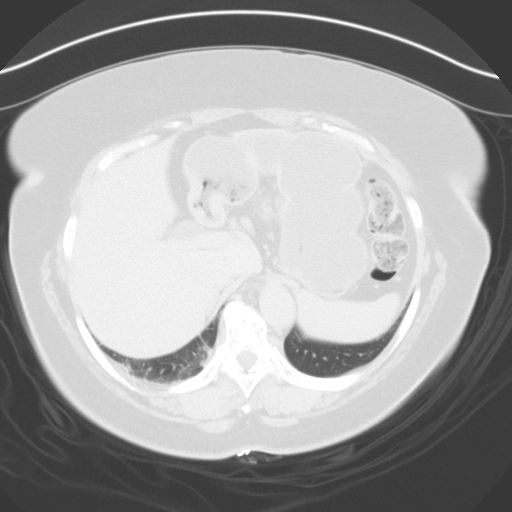

[Series 19: axial pre · axial · non-contrast · 0.74mm/px · z∈[-468,-132]mm · 7 of 242 slices shown]
[im 25/242  soft-tissue]
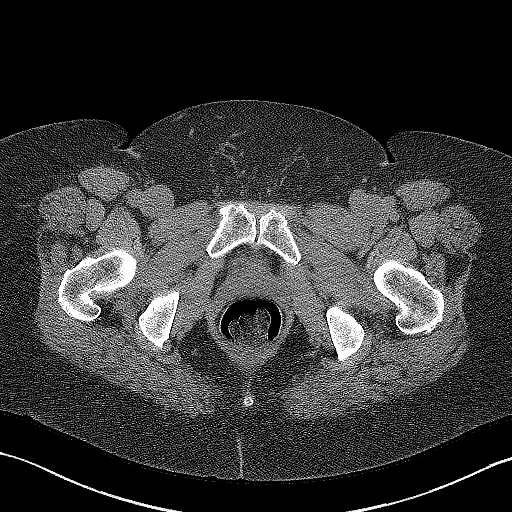
[im 49/242  soft-tissue]
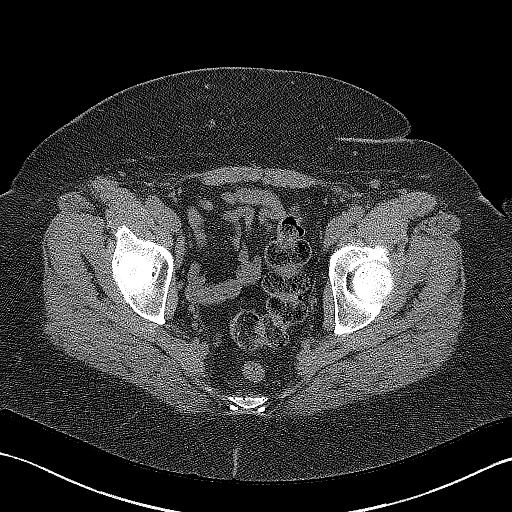
[im 73/242  soft-tissue]
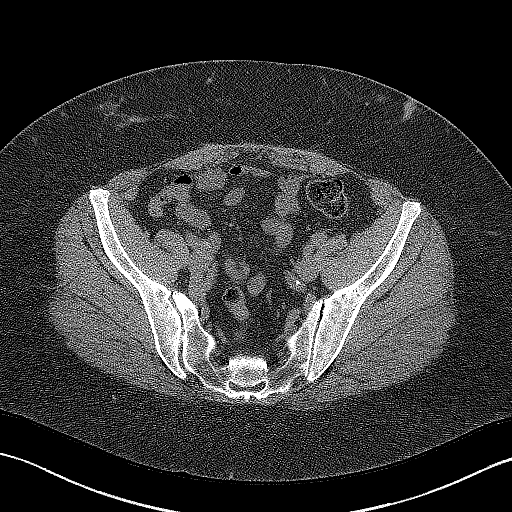
[im 97/242  soft-tissue]
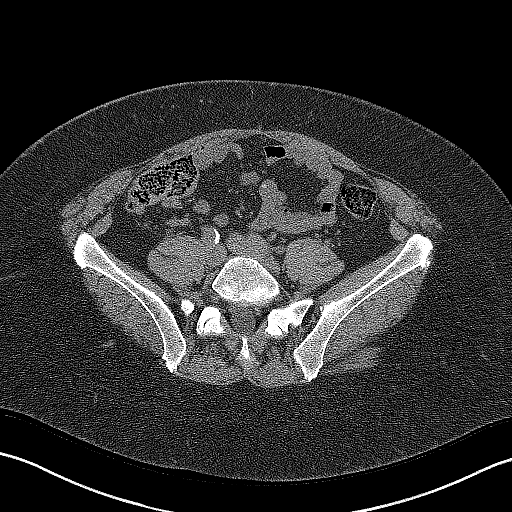
[im 145/242  soft-tissue]
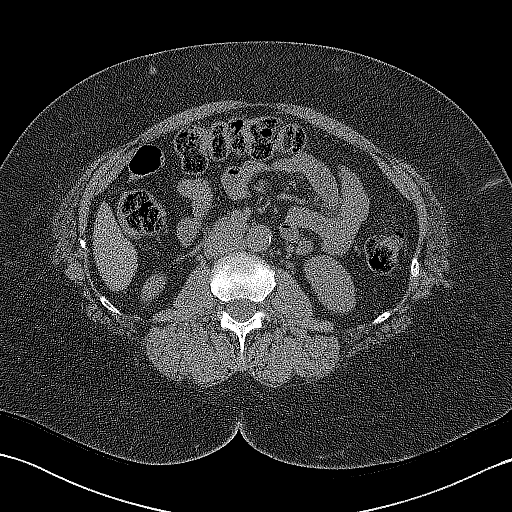
[im 169/242  soft-tissue]
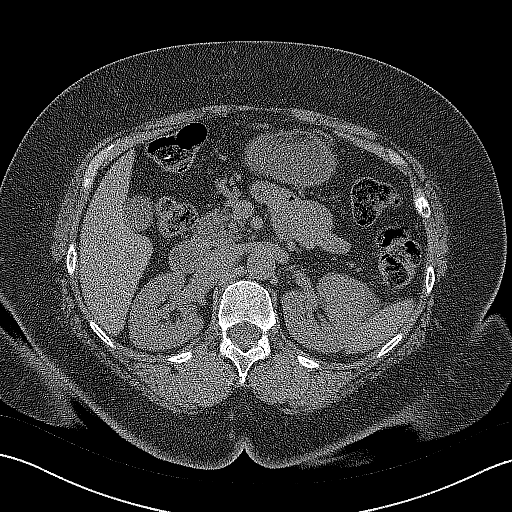
[im 193/242  soft-tissue]
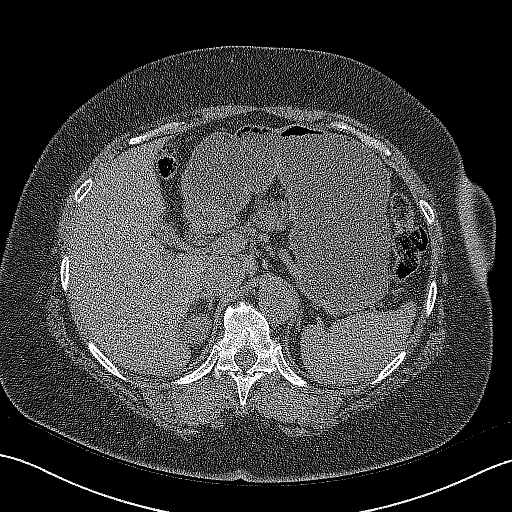

[12 of 46 positions shown; findings below may reference images not displayed]

FINDINGS: Lower chest: No acute abnormality.  Small hiatal hernia.

Hepatobiliary: No solid liver abnormality is seen. Small gallstones
in the gallbladder. No gallbladder wall thickening, or biliary
dilatation.

Pancreas: Unremarkable. No pancreatic ductal dilatation or
surrounding inflammatory changes.

Spleen: Normal in size without significant abnormality.

Adrenals/Urinary Tract: Adrenal glands are unremarkable. Benign
simple cyst of the lateral midportion the left kidney. Kidneys are
otherwise normal, without renal calculi, solid lesion, or
hydronephrosis. No urinary tract filling defect on delayed phase
imaging. Bladder is unremarkable.

Stomach/Bowel: Stomach is within normal limits. Appendix appears
normal. No evidence of bowel wall thickening, distention, or
inflammatory changes.

Vascular/Lymphatic: Aortic atherosclerosis. No enlarged abdominal or
pelvic lymph nodes.

Reproductive: Status post hysterectomy.

Other: No abdominal wall hernia or abnormality. No abdominopelvic
ascites.

Musculoskeletal: No acute or significant osseous findings.
IMPRESSION: 1. No CT findings to explain hematuria. No evidence of urinary tract
calculus, hydronephrosis, or urinary tract filling defect.
2. Status post hysterectomy.
3. Cholelithiasis.

Aortic Atherosclerosis ([id number]-HQ7.7).

## 2023-03-12 ENCOUNTER — Encounter: Payer: Self-pay | Admitting: Medical-Surgical

## 2023-03-12 ENCOUNTER — Ambulatory Visit (INDEPENDENT_AMBULATORY_CARE_PROVIDER_SITE_OTHER): Payer: Medicare Other | Admitting: Medical-Surgical

## 2023-03-12 VITALS — BP 129/77 | HR 73 | Resp 20 | Ht 68.0 in | Wt 273.5 lb

## 2023-03-12 DIAGNOSIS — I10 Essential (primary) hypertension: Secondary | ICD-10-CM | POA: Diagnosis not present

## 2023-03-12 DIAGNOSIS — R109 Unspecified abdominal pain: Secondary | ICD-10-CM | POA: Diagnosis not present

## 2023-03-12 DIAGNOSIS — E118 Type 2 diabetes mellitus with unspecified complications: Secondary | ICD-10-CM

## 2023-03-12 DIAGNOSIS — Z23 Encounter for immunization: Secondary | ICD-10-CM

## 2023-03-12 LAB — POCT URINALYSIS DIP (CLINITEK)
Bilirubin, UA: NEGATIVE
Glucose, UA: NEGATIVE mg/dL
Leukocytes, UA: NEGATIVE
Nitrite, UA: NEGATIVE
POC PROTEIN,UA: NEGATIVE
Spec Grav, UA: 1.025 (ref 1.010–1.025)
Urobilinogen, UA: 0.2 E.U./dL
pH, UA: 5.5 (ref 5.0–8.0)

## 2023-03-12 LAB — POCT GLYCOSYLATED HEMOGLOBIN (HGB A1C)
HbA1c, POC (controlled diabetic range): 6.2 % (ref 0.0–7.0)
Hemoglobin A1C: 6.2 % — AB (ref 4.0–5.6)

## 2023-03-12 LAB — POCT UA - MICROALBUMIN
Albumin/Creatinine Ratio, Urine, POC: <30
Creatinine, POC: 300 mg/dL
Microalbumin Ur, POC: 80 mg/L

## 2023-03-12 MED ORDER — IRBESARTAN 300 MG PO TABS: ORAL_TABLET | ORAL | 3 refills | Status: DC

## 2023-03-12 MED ORDER — LANCETS MISC. MISC: 1.0000 | Freq: Four times a day (QID) | 11 refills | Status: AC | PRN

## 2023-03-12 MED ORDER — LANCET DEVICE MISC: 1.0000 | Freq: Four times a day (QID) | 0 refills | Status: AC | PRN

## 2023-03-12 MED ORDER — BLOOD GLUCOSE TEST VI STRP: 1.0000 | ORAL_STRIP | Freq: Four times a day (QID) | 11 refills | Status: AC | PRN

## 2023-03-12 MED ORDER — BLOOD GLUCOSE MONITORING SUPPL DEVI: 1.0000 | Freq: Three times a day (TID) | 0 refills | Status: AC | PRN

## 2023-03-12 NOTE — Progress Notes (Signed)
Established Patient Office Visit  Subjective   Patient ID: Natalie Carney, female   DOB: [birth year] Age: 67 y.o. MRN: [id number]   Chief Complaint  Patient presents with   Follow-up   Diabetes   Hypertension   HPI Pleasant 67 year old female presenting today for the following:  Diabetes: Has not been checking her sugar at home because she cannot find her glucometer after moving.  Continues to take semaglutide 1 mg weekly, tolerating well without side effects.  Working to follow a [employer].  Hypertension: Taking irbesartan 300 mg daily, tolerating well without side effects.  Not checking her blood pressure at home although they do have a cuff.  Following a low-sodium diet. Denies CP, SOB, palpitations, lower extremity edema, dizziness, headaches, or vision changes.  Issues with acute left flank pain intermittently.  Has noted this for the past week and admits that she has not been drinking enough water each day.  Would like to have her urine checked to rule out any urinary tract infection.  Denies burning, frequency, urgency, hesitancy, incomplete emptying, and hematuria.  No fever, chills, or nausea.   Objective:    Vitals:   03/12/23 1456  BP: 129/77  Pulse: 73  Resp: 20  Height: 5\' 8"  (1.727 m)  Weight: 273 lb 8 oz (124.1 kg)  SpO2: 100%  BMI (Calculated): 41.6    Physical Exam Vitals reviewed.  Constitutional:      General: She is not in acute distress.    Appearance: Normal appearance. She is not ill-appearing.  HENT:     Head: Normocephalic and atraumatic.  Cardiovascular:     Rate and Rhythm: Normal rate and regular rhythm.     Pulses: Normal pulses.     Heart sounds: Normal heart sounds.  Pulmonary:     Effort: Pulmonary effort is normal. No respiratory distress.     Breath sounds: Normal breath sounds. No wheezing, rhonchi or rales.  Skin:    General: Skin is warm and dry.  Neurological:     Mental Status: She is alert and oriented to person, place, and  time.  Psychiatric:        Mood and Affect: Mood normal.        Behavior: Behavior normal.        Thought Content: Thought content normal.        Judgment: Judgment normal.    Results for orders placed or performed in visit on 03/12/23 (from the past 24 hour(s))  POCT UA - Microalbumin     Status: Normal   Collection Time: 03/12/23  4:15 PM  Result Value Ref Range   Microalbumin Ur, POC 80 mg/L   Creatinine, POC 300 mg/dL   Albumin/Creatinine Ratio, Urine, POC <30   POCT URINALYSIS DIP (CLINITEK)     Status: Abnormal   Collection Time: 03/12/23  4:15 PM  Result Value Ref Range   Color, UA yellow yellow   Clarity, UA clear clear   Glucose, UA negative negative mg/dL   Bilirubin, UA negative negative   Ketones, POC UA trace (5) (A) negative mg/dL   Spec Grav, UA 1.025 1.010 - 1.025   Blood, UA small (A) negative   pH, UA 5.5 5.0 - 8.0   POC PROTEIN,UA negative negative, trace   Urobilinogen, UA 0.2 0.2 or 1.0 E.U./dL   Nitrite, UA Negative Negative   Leukocytes, UA Negative Negative  POCT HgB A1C     Status: Abnormal   Collection Time: 03/12/23  4:18 PM  Result Value Ref Range   Hemoglobin A1C 6.2 (A) 4.0 - 5.6 %   HbA1c POC (<> result, manual entry)     HbA1c, POC (prediabetic range)     HbA1c, POC (controlled diabetic range) 6.2 0.0 - 7.0 %       The 10-year ASCVD risk score (Arnett DK, et al., 2019) is: 20%   Values used to calculate the score:     Age: 35 years     Sex: Female     Is Non-Hispanic African American: Yes     Diabetic: Yes     Tobacco smoker: No     Systolic Blood Pressure: [id number] mmHg     Is BP treated: Yes     HDL Cholesterol: 35 mg/dL     Total Cholesterol: 169 mg/dL   Assessment & Plan:   1. Controlled type 2 diabetes mellitus with complication, without long-term current use of insulin (HCC) POCT microalbumin normal.  POCT hemoglobin A1c at 6.2%.  She is very well-controlled so continue semaglutide 1 mg weekly.  Prescriptions for a new  glucometer sent to the pharmacy so that she can resume checking her sugars. - POCT UA - Microalbumin - POCT HgB A1C  2. Essential hypertension Blood pressure at goal today.  Continue irbesartan 300 mg daily.  Continue low-sodium diet.  Recommend regular intentional exercise and continued efforts for weight loss.  Monitor blood pressure at home with a goal of 130/80 or less.  If consistently higher, return for further evaluation and medication management.  3. Acute left flank pain POCT urinalysis positive for small amount of blood and trace ketones.  Sending for culture. - Urine Culture - POCT URINALYSIS DIP (CLINITEK)  4. Need for vaccination with 13-polyvalent pneumococcal conjugate vaccine Pneumococcal vaccine given in office today.  Next will be due in 1 year. - Pneumococcal conjugate vaccine 13-valent  Return in about 6 months (around 09/12/2023) for DM/HTN/HLD follow up.  ___________________________________________ Clearnce Sorrel, DNP, APRN, FNP-BC [facility]

## 2023-03-13 LAB — URINE CULTURE
MICRO NUMBER:: 14711761
SPECIMEN QUALITY:: ADEQUATE

## 2023-04-05 ENCOUNTER — Telehealth: Payer: Self-pay

## 2023-04-05 ENCOUNTER — Other Ambulatory Visit: Payer: Self-pay | Admitting: Medical-Surgical

## 2023-04-05 MED ORDER — SEMAGLUTIDE (1 MG/DOSE) 4 MG/3ML ~~LOC~~ SOPN: 1.0000 mg | PEN_INJECTOR | SUBCUTANEOUS | 3 refills | Status: DC

## 2023-04-05 NOTE — Telephone Encounter (Signed)
Refills sent

## 2023-04-05 NOTE — Telephone Encounter (Signed)
I retrieved a vm from the pt stating that she now has insurance and she is requesting to refill Ozempic. Okay to send?

## 2023-04-12 ENCOUNTER — Other Ambulatory Visit: Payer: Self-pay

## 2023-04-12 DIAGNOSIS — I824Y9 Acute embolism and thrombosis of unspecified deep veins of unspecified proximal lower extremity: Secondary | ICD-10-CM

## 2023-04-12 DIAGNOSIS — I2699 Other pulmonary embolism without acute cor pulmonale: Secondary | ICD-10-CM

## 2023-04-12 MED ORDER — APIXABAN 5 MG PO TABS
ORAL_TABLET | ORAL | 0 refills | Status: DC
Start: 2023-04-12 — End: 2023-06-16

## 2023-04-15 ENCOUNTER — Other Ambulatory Visit: Payer: Self-pay

## 2023-04-15 MED ORDER — SEMAGLUTIDE (1 MG/DOSE) 4 MG/3ML ~~LOC~~ SOPN: 1.0000 mg | PEN_INJECTOR | SUBCUTANEOUS | 3 refills | Status: DC

## 2023-04-24 ENCOUNTER — Telehealth: Payer: Self-pay | Admitting: Medical-Surgical

## 2023-04-24 NOTE — Telephone Encounter (Signed)
Patient called tor request PA for ozempic she stated she changed insurance and would like a follow up on status she is out

## 2023-04-26 ENCOUNTER — Telehealth: Payer: Self-pay

## 2023-04-26 NOTE — Telephone Encounter (Signed)
Patient is out of Ozempic, and prescription wasn't sent to her insurance, please advise, thanks.

## 2023-05-01 ENCOUNTER — Ambulatory Visit (INDEPENDENT_AMBULATORY_CARE_PROVIDER_SITE_OTHER): Payer: Medicare Other

## 2023-05-01 ENCOUNTER — Encounter: Payer: Self-pay | Admitting: Emergency Medicine

## 2023-05-01 ENCOUNTER — Ambulatory Visit
Admission: EM | Admit: 2023-05-01 | Discharge: 2023-05-01 | Disposition: A | Discharge status: Home / Self Care | Payer: Medicare Other | Attending: Family Medicine | Admitting: Family Medicine

## 2023-05-01 DIAGNOSIS — M25562 Pain in left knee: Secondary | ICD-10-CM | POA: Diagnosis not present

## 2023-05-01 MED ORDER — TRAMADOL HCL 50 MG PO TABS: 50.0000 mg | ORAL_TABLET | Freq: Three times a day (TID) | ORAL | 0 refills | Status: AC | PRN

## 2023-05-01 NOTE — ED Triage Notes (Signed)
Pt states 2 weeks ago she twisted her left knee the wrong way and has been having pain since then that is not improving with muscle rub and ice.

## 2023-05-01 NOTE — Discharge Instructions (Addendum)
Advised patient of left knee x-ray results with hardcopy provided to patient.  Advised patient may use Tramadol for breakthrough left knee pain otherwise may use OTC Tylenol 1 g daily, as needed for left knee pain.  Encouraged patient to increase daily water intake to 64 ounces per day while taking these medications.  Advised if symptoms worsen and/or unresolved please follow-up with PCP or [facility] orthopedic provider for further evaluation.

## 2023-05-01 NOTE — ED Provider Notes (Signed)
[facility]    CSN: [id number] Arrival date & time: 05/01/23  1735      History   Chief Complaint Chief Complaint  Patient presents with   Knee Pain    HPI Natalie Carney is a 67 y.o. female.   HPI patient presents with left knee pain.  Patient reports falling 7 to 10 days ago.  PMH significant for morbid obesity, history of pulmonary embolism, and history of DVT.  Patient is currently on apixaban and denies any unusual bleeding.  Past Medical History:  Diagnosis Date   Abnormal mammogram    Arthritis    both knees   Colon polyp    Complication of anesthesia    DVT (deep venous thrombosis) (HCC)    several dvt's last dvt was 3 to 4 yrs ago per pt on 06-14-2022   GERD (gastroesophageal reflux disease)    Hypertension    Lymphedema    both legs uses compression machine at home and wears compression hose   PONV (postoperative nausea and vomiting)    post op ponv after myometcomy yrs ago   Posterior vaginal wall prolapse    Pulmonary embolism (HCC)    yrs ago   Type 2 Diabetes mellitus without complication [facility])     Patient Active Problem List   Diagnosis Date Noted   Hematuria 04/03/2022   Urge incontinence 04/03/2022   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   Chronic anticoagulation 04/11/2021   Essential hypertension 04/11/2021   Controlled type 2 diabetes mellitus with complication, without long-term current use of insulin (HCC) 04/11/2021   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   Venous (peripheral) insufficiency 03/14/2021    Past Surgical History:  Procedure Laterality Date   ABDOMINAL HYSTERECTOMY  2005   KNEE SURGERY Right    arthroscopic right knee   LAPAROSCOPIC GASTRIC BANDING     MYOMECTOMY  2003   RECTOCELE REPAIR N/A 06/18/2022   Procedure: POSTERIOR REPAIR (RECTOCELE), ILLIOCOCCYGEAL SUSPENSION;  Surgeon: Marguerita Beards, MD;  Location: [facility] ;  Service: Gynecology;  Laterality: N/A;   SHOULDER SURGERY  Left    x 2 yrs ago    OB History     Gravida  1   Para  1   Term      Preterm      AB      Living  1      SAB      IAB      Ectopic      Multiple      Live Births  1            Home Medications    Prior to Admission medications   Medication Sig Start Date End Date Taking? Authorizing Provider  traMADol (ULTRAM) 50 MG tablet Take 1 tablet (50 mg total) by mouth every 8 (eight) hours as needed for up to 7 days. 05/01/23 05/08/23 Yes Trevor Iha, FNP  acetaminophen (TYLENOL) 500 MG tablet Take 1 tablet (500 mg total) by mouth every 6 (six) hours as needed (pain). 05/29/22   Marguerita Beards, MD  apixaban (ELIQUIS) 5 MG TABS tablet TAKE 1 TABLET(5 MG) BY MOUTH TWICE DAILY 04/12/23   Josph Macho, MD  betamethasone dipropionate (DIPROLENE) 0.05 % ointment Apply topically 2 (two) times daily.    [provider]  Blood Glucose Monitoring Suppl DEVI 1 each by Does not apply route 3 (three) times daily as needed. May substitute to any manufacturer covered by patient's insurance. 03/12/23  Christen Butter, NP  Cholecalciferol (D3 2000) 50 MCG (2000 UT) CAPS Take by mouth.    [provider]  esomeprazole (NEXIUM) 40 MG capsule Take 40 mg by mouth daily at 12 noon.    [provider]  Glucose Blood (BLOOD GLUCOSE TEST STRIPS) STRP 1 each by In Vitro route 4 (four) times daily as needed. May substitute to any manufacturer covered by patient's insurance. 03/12/23   Christen Butter, NP  irbesartan (AVAPRO) 300 MG tablet TAKE 1 TABLET(300 MG) BY MOUTH DAILY 03/12/23   Christen Butter, NP  magnesium gluconate (MAGONATE) 500 MG tablet Take 500 mg by mouth daily.    [provider]  mirabegron ER (MYRBETRIQ) 50 MG TB24 tablet Take 1 tablet (50 mg total) by mouth daily. 02/01/23   Marguerita Beards, MD  mometasone (ELOCON) 0.1 % ointment Apply topically. 03/11/23   [provider]  multivitamin-lutein (OCUVITE-LUTEIN) CAPS capsule Take 1 capsule  by mouth daily.    [provider]  ondansetron (ZOFRAN) 4 MG tablet Take 1 tablet (4 mg total) by mouth every 8 (eight) hours as needed for nausea or vomiting. 07/10/22   Marguerita Beards, MD  Semaglutide, 1 MG/DOSE, 4 MG/3ML SOPN Inject 1 mg as directed once a week. 04/15/23   Christen Butter, NP    Family History Family History  Problem Relation Age of Onset   Pneumonia Mother    Diabetes Father    Hypertension Father    Heart attack Father    Cervical cancer Maternal Grandmother    Breast cancer Maternal Aunt    Breast cancer Other        cousin   Ovarian cancer Other        m. grandmother    Social History Social History   Tobacco Use   Smoking status: Former    Types: Cigarettes    Quit date: 12/25/1987    Years since quitting: 35.3   Smokeless tobacco: Never   Tobacco comments:    Social smoker quit 1989  Vaping Use   Vaping Use: Never used  Substance Use Topics   Alcohol use: Never   Drug use: Never     Allergies   Nsaids   Review of Systems Review of Systems  Musculoskeletal:        Left knee pain x 2 weeks  All other systems reviewed and are negative.    Physical Exam Triage Vital Signs ED Triage Vitals  Enc Vitals Group     BP      Pulse      Resp      Temp      Temp src      SpO2      Weight      Height      Head Circumference      Peak Flow      Pain Score      Pain Loc      Pain Edu?      Excl. in GC?    No data found.  Updated Vital Signs BP 116/77 (BP Location: Right Arm)   Pulse 90   Temp 98 F (36.7 C) (Oral)   Resp 18   SpO2 99%    Physical Exam Vitals and nursing note reviewed.  Constitutional:      Appearance: Normal appearance. She is obese.  HENT:     Head: Normocephalic and atraumatic.     Mouth/Throat:     Mouth: Mucous membranes are moist.  Pharynx: Oropharynx is clear.  Eyes:     Extraocular Movements: Extraocular movements intact.     Conjunctiva/sclera: Conjunctivae normal.     Pupils:  Pupils are equal, round, and reactive to light.  Cardiovascular:     Rate and Rhythm: Normal rate and regular rhythm.     Pulses: Normal pulses.     Heart sounds: Normal heart sounds.  Pulmonary:     Effort: Pulmonary effort is normal.     Breath sounds: Normal breath sounds. No wheezing, rhonchi or rales.  Musculoskeletal:        General: Normal range of motion.     Cervical back: Normal range of motion and neck supple.     Comments: Left knee (anterior aspect): TTP over superior lateral aspect of patella with moderate soft tissue swelling noted limited exam due to pain  Skin:    General: Skin is warm and dry.  Neurological:     General: No focal deficit present.     Mental Status: She is alert and oriented to person, place, and time. Mental status is at baseline.      UC Treatments / Results  Labs (all labs ordered are listed, but only abnormal results are displayed) Labs Reviewed - No data to display  EKG   Radiology DG Knee Complete 4 Views Left  Result Date: 05/01/2023 CLINICAL DATA:  Left knee pain for 2 weeks. EXAM: LEFT KNEE - COMPLETE 4+ VIEW COMPARISON:  Knee radiograph 10/11/2021 FINDINGS: No fracture. Normal alignment without dislocation. Tricompartmental osteoarthritis with peripheral spurring. Minimal knee joint effusion. Diminutive quadriceps and patellar tendon enthesophytes. No erosive change or focal bone abnormality. IMPRESSION: 1. Mild tricompartmental osteoarthritis with minimal joint effusion. 2. No acute findings. Electronically Signed   By: Narda Rutherford M.D.   On: 05/01/2023 18:27    Procedures Procedures (including critical care time)  Medications Ordered in UC Medications - No data to display  Initial Impression / Assessment and Plan / UC Course  I have reviewed the triage vital signs and the nursing notes.  Pertinent labs & imaging results that were available during my care of the patient were reviewed by me and considered in my medical  decision making (see chart for details).     MDM: 1.  Acute pain of left knee-left knee x-ray revealed above. Advised patient of left knee x-ray results with hardcopy provided to patient.  Advised patient may use Tramadol for breakthrough left knee pain otherwise may use OTC Tylenol 1 g daily, as needed for left knee pain.  Encouraged patient to increase daily water intake to 64 ounces per day while taking these medications.  Advised if symptoms worsen and/or unresolved please follow-up with PCP or [facility] orthopedic provider for further evaluation.  Patient discharged home, hemodynamically stable. Final Clinical Impressions(s) / UC Diagnoses   Final diagnoses:  Acute pain of left knee     Discharge Instructions      Advised patient of left knee x-ray results with hardcopy provided to patient.  Advised patient may use Tramadol for breakthrough left knee pain otherwise may use OTC Tylenol 1 g daily, as needed for left knee pain.  Encouraged patient to increase daily water intake to 64 ounces per day while taking these medications.  Advised if symptoms worsen and/or unresolved please follow-up with PCP or [facility]opedic provider for further evaluation.     ED Prescriptions     Medication Sig Dispense Auth. Provider   traMADol (ULTRAM) 50 MG tablet Take  1 tablet (50 mg total) by mouth every 8 (eight) hours as needed for up to 7 days. 21 tablet Trevor Iha, FNP      I have reviewed the PDMP during this encounter.   Trevor Iha, FNP 05/01/23 1902

## 2023-05-03 ENCOUNTER — Telehealth: Payer: Self-pay | Admitting: Medical-Surgical

## 2023-05-03 NOTE — Telephone Encounter (Signed)
Per patient the insurance company sent authorization fax to [place] please look out for this .

## 2023-05-04 ENCOUNTER — Telehealth: Payer: Self-pay | Admitting: Emergency Medicine

## 2023-05-04 ENCOUNTER — Other Ambulatory Visit: Payer: Self-pay | Admitting: Obstetrics and Gynecology

## 2023-05-04 DIAGNOSIS — N3941 Urge incontinence: Secondary | ICD-10-CM

## 2023-05-04 NOTE — Telephone Encounter (Signed)
Message left by patient regarding ongoing pain - no relief with tramadol. Pt was told to call back if pain medication was not working. Provider here today to review and RN will follow up with patient.

## 2023-05-07 NOTE — Telephone Encounter (Signed)
Pt called. Pt states paperwork for PA for her Ozempic  1 mg. was filled out incorrectly. She has changed insurances and they need an initial authorization

## 2023-05-13 ENCOUNTER — Telehealth: Payer: Self-pay | Admitting: Medical-Surgical

## 2023-05-13 NOTE — Telephone Encounter (Signed)
Patient called in regards to Semaglutide 1mg  she states that her insurance denied her she requests a follow up ASAP she has been without her medication about 2 months please advise

## 2023-05-15 ENCOUNTER — Telehealth: Payer: Self-pay | Admitting: Medical-Surgical

## 2023-05-15 ENCOUNTER — Telehealth: Payer: Self-pay

## 2023-05-15 NOTE — Telephone Encounter (Signed)
Pt called.  She states she has not been on her Ozempic for 2 mnths and wants to know what should she do in the meantime. Today numbers were 221 without food usually they are between 110-117.

## 2023-05-15 NOTE — Telephone Encounter (Addendum)
Initiated Prior authorization ZOX:[id number] (1 MG/DOSE) 4MG /3ML pen-injectors Via: Covermymeds Case/Key:[id number] Status: approved as of 05/15/23 Reason:approved through 05/15/24 Notified Pt via: Mychart

## 2023-05-16 NOTE — Telephone Encounter (Signed)
Patient picked up one box of Ozempic.  Advised patient to do 0.5 once a week.  The patient was on the phone with the insurance company while waiting to advise here and she stated that the insurance has approved it and it should be at the pharmacy tomorrow.

## 2023-06-14 ENCOUNTER — Other Ambulatory Visit: Payer: Self-pay | Admitting: Hematology & Oncology

## 2023-06-14 DIAGNOSIS — I824Y9 Acute embolism and thrombosis of unspecified deep veins of unspecified proximal lower extremity: Secondary | ICD-10-CM

## 2023-06-14 DIAGNOSIS — I2699 Other pulmonary embolism without acute cor pulmonale: Secondary | ICD-10-CM

## 2023-07-25 ENCOUNTER — Other Ambulatory Visit: Payer: Self-pay | Admitting: Medical-Surgical

## 2023-07-25 DIAGNOSIS — Z1231 Encounter for screening mammogram for malignant neoplasm of breast: Secondary | ICD-10-CM

## 2023-07-30 ENCOUNTER — Other Ambulatory Visit: Payer: Self-pay | Admitting: Family

## 2023-07-30 DIAGNOSIS — I824Y9 Acute embolism and thrombosis of unspecified deep veins of unspecified proximal lower extremity: Secondary | ICD-10-CM

## 2023-07-30 DIAGNOSIS — I2699 Other pulmonary embolism without acute cor pulmonale: Secondary | ICD-10-CM

## 2023-07-31 ENCOUNTER — Encounter: Payer: Self-pay | Admitting: Family

## 2023-07-31 ENCOUNTER — Inpatient Hospital Stay (HOSPITAL_BASED_OUTPATIENT_CLINIC_OR_DEPARTMENT_OTHER): Payer: Medicare Other | Admitting: Family

## 2023-07-31 ENCOUNTER — Inpatient Hospital Stay: Payer: Medicare Other | Attending: Hematology & Oncology

## 2023-07-31 DIAGNOSIS — Z7901 Long term (current) use of anticoagulants: Secondary | ICD-10-CM | POA: Insufficient documentation

## 2023-07-31 DIAGNOSIS — M79605 Pain in left leg: Secondary | ICD-10-CM | POA: Diagnosis not present

## 2023-07-31 DIAGNOSIS — I2699 Other pulmonary embolism without acute cor pulmonale: Secondary | ICD-10-CM

## 2023-07-31 DIAGNOSIS — I824Y9 Acute embolism and thrombosis of unspecified deep veins of unspecified proximal lower extremity: Secondary | ICD-10-CM | POA: Diagnosis not present

## 2023-07-31 DIAGNOSIS — M7989 Other specified soft tissue disorders: Secondary | ICD-10-CM | POA: Insufficient documentation

## 2023-07-31 DIAGNOSIS — M79604 Pain in right leg: Secondary | ICD-10-CM | POA: Insufficient documentation

## 2023-07-31 DIAGNOSIS — I872 Venous insufficiency (chronic) (peripheral): Secondary | ICD-10-CM | POA: Diagnosis present

## 2023-07-31 DIAGNOSIS — I890 Lymphedema, not elsewhere classified: Secondary | ICD-10-CM | POA: Diagnosis not present

## 2023-07-31 LAB — CMP (CANCER CENTER ONLY)
ALT: 21 U/L (ref 0–44)
AST: 15 U/L (ref 15–41)
Albumin: 3.9 g/dL (ref 3.5–5.0)
Alkaline Phosphatase: 84 U/L (ref 38–126)
Anion gap: 9 (ref 5–15)
BUN: 12 mg/dL (ref 8–23)
CO2: 28 mmol/L (ref 22–32)
Calcium: 8.9 mg/dL (ref 8.9–10.3)
Chloride: 106 mmol/L (ref 98–111)
Creatinine: 0.88 mg/dL (ref 0.44–1.00)
GFR, Estimated: >60 mL/min (ref >60)
Glucose, Bld: 156 mg/dL — ABNORMAL HIGH (ref 70–99)
Potassium: 3.8 mmol/L (ref 3.5–5.1)
Sodium: 143 mmol/L (ref 135–145)
Total Bilirubin: 0.5 mg/dL (ref 0.3–1.2)
Total Protein: 7.3 g/dL (ref 6.5–8.1)

## 2023-07-31 LAB — CBC WITH DIFFERENTIAL (CANCER CENTER ONLY)
Abs Immature Granulocytes: 0.03 10*3/uL (ref 0.00–0.07)
Basophils Absolute: 0 10*3/uL (ref 0.0–0.1)
Basophils Relative: 0 %
Eosinophils Absolute: 0.1 10*3/uL (ref 0.0–0.5)
Eosinophils Relative: 2 %
HCT: 38.5 % (ref 36.0–46.0)
Hemoglobin: 11.9 g/dL — ABNORMAL LOW (ref 12.0–15.0)
Immature Granulocytes: 0 %
Lymphocytes Relative: 43 %
Lymphs Abs: 2.9 10*3/uL (ref 0.7–4.0)
MCH: 25.3 pg — ABNORMAL LOW (ref 26.0–34.0)
MCHC: 30.9 g/dL (ref 30.0–36.0)
MCV: 81.9 fL (ref 80.0–100.0)
Monocytes Absolute: 0.4 10*3/uL (ref 0.1–1.0)
Monocytes Relative: 6 %
Neutro Abs: 3.4 10*3/uL (ref 1.7–7.7)
Neutrophils Relative %: 49 %
Platelet Count: 241 10*3/uL (ref 150–400)
RBC: 4.7 MIL/uL (ref 3.87–5.11)
RDW: 14.5 % (ref 11.5–15.5)
WBC Count: 6.8 10*3/uL (ref 4.0–10.5)
nRBC: 0 % (ref 0.0–0.2)

## 2023-07-31 LAB — D-DIMER, QUANTITATIVE: D-Dimer, Quant: 0.32 ug/mL-FEU (ref 0.00–0.50)

## 2023-07-31 MED ORDER — APIXABAN 5 MG PO TABS
5.0000 mg | ORAL_TABLET | Freq: Two times a day (BID) | ORAL | 2 refills | Status: DC
Start: 2023-07-31 — End: 2024-03-11

## 2023-07-31 NOTE — Progress Notes (Signed)
Hematology and Oncology Follow Up Visit  Joreen Stinar [id number] 23-Nov-1956 67 y.o. 07/31/2023   Principle Diagnosis:  History of multiple thrombotic events secondary to venous insufficiency    Past Therapy: Failed Coumadin and Xarelto - recurrence     Current Therapy:        Eliquis 5 mg PO BID    Interim History:  Ms. Biatriz Husic is here today for follow-up. She is doing well but has noted some weight gain while off Ozempic. She had to wait 2-3 months to restart and is a little discouraged. She plans to discuss with her PCP.  Appetite and hydration are adequate. Weight today is 282 lbs.  No blood loss noted. No bruising or petechiae.  No fever, chills, n/v, cough, dizziness, SOB, chest pain, palpitations, abdominal pain or changes in bowel or bladder habits.  She has been diagnosed with eczema and has been using a prescription cream for outbreaks on the legs.  She has intermittent swelling in her lower extremities. She wears compression stockings when she can.  She uses a lymphedema machine daily which helps reduce fluid retention.  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  No falls or syncope reported.   ECOG Performance Status: 1 - Symptomatic but completely ambulatory  Medications:  Allergies as of 07/31/2023       Reactions   Nsaids Other (See Comments)   On Blood Thinners        Medication List        Accurate as of July 31, 2023  1:29 PM. If you have any questions, ask your nurse or doctor.          acetaminophen 500 MG tablet Commonly known as: TYLENOL Take 1 tablet (500 mg total) by mouth every 6 (six) hours as needed (pain).   betamethasone dipropionate 0.05 % ointment Commonly known as: DIPROLENE Apply topically 2 (two) times daily.   Blood Glucose Monitoring Suppl Devi 1 each by Does not apply route 3 (three) times daily as needed. May substitute to any manufacturer covered by patient's insurance.   BLOOD  GLUCOSE TEST STRIPS Strp 1 each by In Vitro route 4 (four) times daily as needed. May substitute to any manufacturer covered by patient's insurance.   D3 2000 50 MCG (2000 UT) Caps Generic drug: Cholecalciferol Take by mouth.   Eliquis 5 MG Tabs tablet Generic drug: apixaban TAKE 1 TABLET BY MOUTH TWICE A DAY   esomeprazole 40 MG capsule Commonly known as: NEXIUM Take 40 mg by mouth daily at 12 noon.   irbesartan 300 MG tablet Commonly known as: AVAPRO TAKE 1 TABLET(300 MG) BY MOUTH DAILY   magnesium gluconate 500 MG tablet Commonly known as: MAGONATE Take 500 mg by mouth daily.   mometasone 0.1 % ointment Commonly known as: ELOCON Apply topically.   multivitamin tablet Take 1 tablet by mouth daily.   multivitamin-lutein Caps capsule Take 1 capsule by mouth daily.   Myrbetriq 50 MG Tb24 tablet Generic drug: mirabegron ER TAKE 1 TABLET(50 MG) BY MOUTH DAILY   ondansetron 4 MG tablet Commonly known as: ZOFRAN Take 1 tablet (4 mg total) by mouth every 8 (eight) hours as needed for nausea or vomiting.   Semaglutide (1 MG/DOSE) 4 MG/3ML Sopn Inject 1 mg as directed once a week.        Allergies:  Allergies  Allergen Reactions   Nsaids Other (See Comments)    On Blood Thinners    Past  Medical History, Surgical history, Social history, and Family History were reviewed and updated.  Review of Systems: All other 10 point review of systems is negative.   Physical Exam:  weight is 282 lb 0.8 oz (127.9 kg). Her oral temperature is 98 F (36.7 C). Her blood pressure is 139/65 and her pulse is 68. Her respiration is 19 and oxygen saturation is 100%.   Wt Readings from Last 3 Encounters:  07/31/23 282 lb 0.8 oz (127.9 kg)  03/12/23 273 lb 8 oz (124.1 kg)  09/11/22 269 lb 9.6 oz (122.3 kg)    Ocular: Sclerae unicteric, pupils equal, round and reactive to light Ear-nose-throat: Oropharynx clear, dentition fair Lymphatic: No cervical or supraclavicular  adenopathy Lungs no rales or rhonchi, good excursion bilaterally Heart regular rate and rhythm, no murmur appreciated Abd soft, nontender, positive bowel sounds MSK no focal spinal tenderness, no joint edema Neuro: non-focal, well-oriented, appropriate affect Breasts: Deferred   Lab Results  Component Value Date   WBC 6.8 07/31/2023   HGB 11.9 (L) 07/31/2023   HCT 38.5 07/31/2023   MCV 81.9 07/31/2023   PLT 241 07/31/2023   Lab Results  Component Value Date   FERRITIN 21 09/14/2022   IRON 57 09/14/2022   TIBC 355 09/14/2022   IRONPCTSAT 16 09/14/2022   Lab Results  Component Value Date   RBC 4.70 07/31/2023   No results found for: "KPAFRELGTCHN", "LAMBDASER", "KAPLAMBRATIO" No results found for: "IGGSERUM", "IGA", "IGMSERUM" No results found for: "TOTALPROTELP", "ALBUMINELP", "A1GS", "A2GS", "BETS", "BETA2SER", "GAMS", "MSPIKE", "SPEI"   Chemistry      Component Value Date/Time   NA 142 09/14/2022 0000   K 4.1 09/14/2022 0000   CL 108 09/14/2022 0000   CO2 27 09/14/2022 0000   BUN 11 09/14/2022 0000   CREATININE 0.79 09/14/2022 0000      Component Value Date/Time   CALCIUM 8.8 09/14/2022 0000   ALKPHOS 89 07/13/2022 0941   AST 14 09/14/2022 0000   AST 14 (L) 07/13/2022 0941   ALT 14 09/14/2022 0000   ALT 13 07/13/2022 0941   BILITOT 0.5 09/14/2022 0000   BILITOT 0.3 07/13/2022 0941       Impression and Plan:  Ms. Autumn Patty is a very pleasant 67 yo African American female with history of multiple thrombotic events and venous insufficiency. Continue same regimen with Eliquis PO BID daily. Refilled today.  Follow-up in 6 months.   Eileen Stanford, NP 8/7/20241:29 PM

## 2023-08-14 ENCOUNTER — Ambulatory Visit: Payer: Medicare Other

## 2023-08-14 DIAGNOSIS — Z1231 Encounter for screening mammogram for malignant neoplasm of breast: Secondary | ICD-10-CM | POA: Diagnosis not present

## 2023-09-10 NOTE — Progress Notes (Unsigned)
ANNUAL EXAM Patient name: Danette Bila MRN [id number]  Date of birth: 1956/10/22 Chief Complaint:   No chief complaint on file.  History of Present Illness:   Natalie Carney is a 67 y.o. G1P1 female being seen today for a routine annual exam.   Current concerns: ***   No LMP recorded. Patient has had a hysterectomy.   Last MXR: 07/2023 wnl, birad1 Last Pap/Pap History: 08/2022. Results were: NILM w/ HRHPV negative. H/O abnormal pap: no   Health Maintenance Due  Topic Date Due   Medicare Annual Wellness (AWV)  Never done   HIV Screening  Never done   Hepatitis C Screening  Never done   DTaP/Tdap/Td (1 - Tdap) Never done   Cervical Cancer Screening (HPV/Pap Cotest)  Never done   Colonoscopy  Never done   FOOT EXAM  10/11/2022   INFLUENZA VACCINE  07/25/2023   Zoster Vaccines- Shingrix (2 of 2) 08/16/2023    Review of Systems:   Pertinent items are noted in HPI Denies any headaches, blurred vision, fatigue, shortness of breath, chest pain, abdominal pain, abnormal vaginal discharge/itching/odor/irritation, problems with periods, bowel movements, urination, or intercourse unless otherwise stated above. *** Pertinent History Reviewed:  Reviewed past medical,surgical, social and family history.  Reviewed problem list, medications and allergies. Physical Assessment:  There were no vitals filed for this visit.There is no height or weight on file to calculate BMI.   Physical Examination:  General appearance - well appearing, and in no distress Mental status - alert, oriented to person, place, and time Psych:  She has a normal mood and affect Skin - warm and dry, normal color, no suspicious lesions noted Chest - effort normal Heart - normal rate  Breasts - breasts appear normal, no suspicious masses, no skin or nipple changes or axillary nodes Abdomen - soft, nontender, nondistended, no masses or organomegaly Pelvic -  VULVA: normal appearing vulva with no  masses, tenderness or lesions  VAGINA: normal appearing vagina with normal color and discharge, no lesions, bilateral levator tenderness   *** CERVIX: I could not see a cervix but possible that I felt one vs redundant tissue UTERUS: Surgically absent ADNEXA: None palpable Extremities:  No swelling or varicosities noted  Chaperone present for exam  No results found for this or any previous visit (from the past 24 hour(s)).  Assessment & Plan:  Diagnoses and all orders for this visit:  Encounter for annual routine gynecological examination  - Cervical cancer screening: Discussed guidelines. Pap with HPV wnl 08/2022 since I was unsure about cervical status. She would be a good candidate to discontinue pap smears.  - Breast Health: Encouraged self breast awareness/SBE. Discussed limits of clinical breast exam for detecting breast cancer. Discussed importance of annual MXR. MXR is up to date: 08/03/2022 - Climacteric/Sexual health: Reviewed typical and atypical symptoms of menopause/peri-menopause. Discussed PMB and to call if any amount of spotting.  - Colonoscopy:  2021 - F/U 12 months and prn  However, we will both work to obtain her records from [place] to see what her history is. If we cannot tell, given her [facility] of ovarian and breast cancer, we could check an [place] to see if she still has her [place] although we reviewed this is not definitive because they may also be too small to see (however this also would be reassuring. She also notes she had BRCA testing in [place] and she was negative.   No orders of the defined types were placed in  this encounter.   Meds: No orders of the defined types were placed in this encounter.   Follow-up: No follow-ups on file.  Milas Hock, MD 09/10/2023 1:46 PM

## 2023-09-11 ENCOUNTER — Ambulatory Visit: Payer: No Typology Code available for payment source | Admitting: Obstetrics and Gynecology

## 2023-09-11 ENCOUNTER — Encounter: Payer: Self-pay | Admitting: Obstetrics and Gynecology

## 2023-09-11 VITALS — BP 135/78 | HR 76 | Resp 16 | Ht 67.0 in | Wt 280.0 lb

## 2023-09-11 DIAGNOSIS — Z01419 Encounter for gynecological examination (general) (routine) without abnormal findings: Secondary | ICD-10-CM | POA: Diagnosis not present

## 2023-09-11 DIAGNOSIS — R1030 Lower abdominal pain, unspecified: Secondary | ICD-10-CM | POA: Diagnosis not present

## 2023-09-11 DIAGNOSIS — M62838 Other muscle spasm: Secondary | ICD-10-CM | POA: Diagnosis not present

## 2023-09-12 ENCOUNTER — Encounter: Payer: Self-pay | Admitting: Medical-Surgical

## 2023-09-12 ENCOUNTER — Ambulatory Visit (INDEPENDENT_AMBULATORY_CARE_PROVIDER_SITE_OTHER): Payer: Medicare Other | Admitting: Medical-Surgical

## 2023-09-12 VITALS — BP 118/73 | HR 64 | Resp 20 | Ht 67.0 in | Wt 280.2 lb

## 2023-09-12 DIAGNOSIS — E118 Type 2 diabetes mellitus with unspecified complications: Secondary | ICD-10-CM

## 2023-09-12 DIAGNOSIS — Z7985 Long-term (current) use of injectable non-insulin antidiabetic drugs: Secondary | ICD-10-CM

## 2023-09-12 DIAGNOSIS — H9193 Unspecified hearing loss, bilateral: Secondary | ICD-10-CM | POA: Diagnosis not present

## 2023-09-12 DIAGNOSIS — I10 Essential (primary) hypertension: Secondary | ICD-10-CM | POA: Diagnosis not present

## 2023-09-12 DIAGNOSIS — Z7901 Long term (current) use of anticoagulants: Secondary | ICD-10-CM

## 2023-09-12 LAB — POCT GLYCOSYLATED HEMOGLOBIN (HGB A1C)
HbA1c, POC (controlled diabetic range): 5.9 % (ref 0.0–7.0)
Hemoglobin A1C: 5.9 % — AB (ref 4.0–5.6)

## 2023-09-12 MED ORDER — SEMAGLUTIDE (2 MG/DOSE) 8 MG/3ML ~~LOC~~ SOPN: 2.0000 mg | PEN_INJECTOR | SUBCUTANEOUS | 0 refills | Status: DC

## 2023-09-12 NOTE — Progress Notes (Signed)
        Established patient visit  History, exam, impression, and plan:  1. Essential hypertension Very pleasant 67 year old female presenting today with a history of hypertension that is currently treated with irbesartan 300 mg daily.  Compliant with medication and reports no significant side effects.  Following a low-sodium diet.  Not regularly checking blood pressure at home.  Denies concerning symptoms today.  Cardiopulmonary exam normal.  Blood pressure at goal.  Continue irbesartan as prescribed.  2. Chronic anticoagulation Managed by hematology.  3. Controlled type 2 diabetes mellitus with complication, without long-term current use of insulin (HCC) History of controlled type 2 diabetes with last hemoglobin A1c of 6.26 months ago.  She has been occasionally checking sugars and reports that her fasting readings are at goal.  Using Ozempic 1 mg weekly, tolerating well without side effects.  Notes that if she does not eat before her injection she does feel nervous and jittery however if she eats her breakfast and then does her injections she is fine.  Originally had some weight loss with this but this has become stagnant and she is now gained about 7 pounds.  Notes a diabetic diet but struggles to get protein in.  Discussed recommendations for dietary management and limitation of carbohydrate intake.  Recheck of her Ambien see today shows 5.9% indicating excellent control.  Due to concerns with the Ozempic effectiveness with weight, we are increasing to 2 mg weekly.  She will try this for a month and let me know if she tolerates it so we can send in refills.  Plan to recheck hemoglobin A1c in 6 months. - POCT HgB A1C  4. Morbid obesity (HCC) Increasing Ozempic as noted above.  5. 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  She saw an ENT with [facility] however had a bad  experience and does not want to see them again.  Would like to see someone else to evaluate this issue.  Referring to ENT. - Ambulatory referral to ENT  Procedures performed this visit: None.  Return in about 6 months (around 03/11/2024) for DM/HTN/HLD follow up.  __________________________________ Thayer Ohm, DNP, APRN, FNP-BC Primary Care and Sports Medicine [facility] [place]

## 2023-09-17 ENCOUNTER — Ambulatory Visit: Payer: Medicare Other

## 2023-09-17 DIAGNOSIS — R1030 Lower abdominal pain, unspecified: Secondary | ICD-10-CM | POA: Diagnosis not present

## 2023-10-09 ENCOUNTER — Other Ambulatory Visit: Payer: Self-pay | Admitting: Medical-Surgical

## 2023-11-04 ENCOUNTER — Other Ambulatory Visit: Payer: Self-pay | Admitting: Medical-Surgical

## 2023-12-28 ENCOUNTER — Other Ambulatory Visit: Payer: Self-pay | Admitting: Medical-Surgical

## 2024-01-31 ENCOUNTER — Inpatient Hospital Stay: Payer: Medicare Other | Attending: Family

## 2024-01-31 ENCOUNTER — Inpatient Hospital Stay (HOSPITAL_BASED_OUTPATIENT_CLINIC_OR_DEPARTMENT_OTHER): Payer: Medicare Other | Admitting: Family

## 2024-01-31 ENCOUNTER — Other Ambulatory Visit: Payer: Self-pay

## 2024-01-31 ENCOUNTER — Encounter: Payer: Self-pay | Admitting: Family

## 2024-01-31 VITALS — BP 128/59 | HR 77 | Temp 98.0°F | Resp 18 | Wt 275.4 lb

## 2024-01-31 DIAGNOSIS — M7989 Other specified soft tissue disorders: Secondary | ICD-10-CM | POA: Diagnosis not present

## 2024-01-31 DIAGNOSIS — I2699 Other pulmonary embolism without acute cor pulmonale: Secondary | ICD-10-CM

## 2024-01-31 DIAGNOSIS — I872 Venous insufficiency (chronic) (peripheral): Secondary | ICD-10-CM | POA: Diagnosis present

## 2024-01-31 DIAGNOSIS — Z7901 Long term (current) use of anticoagulants: Secondary | ICD-10-CM | POA: Diagnosis not present

## 2024-01-31 DIAGNOSIS — I824Y9 Acute embolism and thrombosis of unspecified deep veins of unspecified proximal lower extremity: Secondary | ICD-10-CM

## 2024-01-31 LAB — CMP (CANCER CENTER ONLY)
ALT: 21 U/L (ref 0–44)
AST: 18 U/L (ref 15–41)
Albumin: 3.8 g/dL (ref 3.5–5.0)
Alkaline Phosphatase: 78 U/L (ref 38–126)
Anion gap: 6 (ref 5–15)
BUN: 9 mg/dL (ref 8–23)
CO2: 28 mmol/L (ref 22–32)
Calcium: 9.1 mg/dL (ref 8.9–10.3)
Chloride: 107 mmol/L (ref 98–111)
Creatinine: 0.84 mg/dL (ref 0.44–1.00)
GFR, Estimated: >60 mL/min (ref >60)
Glucose, Bld: 96 mg/dL (ref 70–99)
Potassium: 4 mmol/L (ref 3.5–5.1)
Sodium: 141 mmol/L (ref 135–145)
Total Bilirubin: 0.4 mg/dL (ref 0.0–1.2)
Total Protein: 7 g/dL (ref 6.5–8.1)

## 2024-01-31 LAB — CBC WITH DIFFERENTIAL (CANCER CENTER ONLY)
Abs Immature Granulocytes: 0.02 10*3/uL (ref 0.00–0.07)
Basophils Absolute: 0 10*3/uL (ref 0.0–0.1)
Basophils Relative: 1 %
Eosinophils Absolute: 0.2 10*3/uL (ref 0.0–0.5)
Eosinophils Relative: 3 %
HCT: 36.9 % (ref 36.0–46.0)
Hemoglobin: 11.8 g/dL — ABNORMAL LOW (ref 12.0–15.0)
Immature Granulocytes: 0 %
Lymphocytes Relative: 40 %
Lymphs Abs: 3 10*3/uL (ref 0.7–4.0)
MCH: 26.1 pg (ref 26.0–34.0)
MCHC: 32 g/dL (ref 30.0–36.0)
MCV: 81.6 fL (ref 80.0–100.0)
Monocytes Absolute: 0.5 10*3/uL (ref 0.1–1.0)
Monocytes Relative: 6 %
Neutro Abs: 3.7 10*3/uL (ref 1.7–7.7)
Neutrophils Relative %: 50 %
Platelet Count: 255 10*3/uL (ref 150–400)
RBC: 4.52 MIL/uL (ref 3.87–5.11)
RDW: 13.9 % (ref 11.5–15.5)
WBC Count: 7.5 10*3/uL (ref 4.0–10.5)
nRBC: 0 % (ref 0.0–0.2)

## 2024-01-31 LAB — D-DIMER, QUANTITATIVE: D-Dimer, Quant: <0.27 ug{FEU}/mL (ref 0.00–0.50)

## 2024-01-31 NOTE — Progress Notes (Signed)
 Hematology and Oncology Follow Up Visit  Natalie Carney [id number] 1956/09/05 68 y.o. 01/31/2024   Principle Diagnosis:  History of multiple thrombotic events secondary to venous insufficiency    Past Therapy: Failed Coumadin and Xarelto - recurrence     Current Therapy:        Eliquis  5 mg PO BID    Interim History:  Natalie Carney is here today for follow-up. She is doing fairly well but has some post phlebitic pain and burning in her legs at times.  She states that she used to have a lymphedema machine and this helped reduce her pain and swelling tremendously. No changes in the pain or lower extremity pain from baseline.  No redness or edema at this time. Pedal pulses are 1+.  No fever, chills, n/v, cough, rash, dizziness, SOB, chest pain, palpitations, abdominal pain or changes in bowel or bladder habits.  She eats prunes and drinks prune juice to help with chronic constipation.  She has not noted any blood loss. No abnormal bruising, no petechiae.  No falls or syncope reported.  Appetite and hydration are good. Weight is stable at 275 lbs.   ECOG Performance Status: 1 - Symptomatic but completely ambulatory  Medications:  Allergies as of 01/31/2024       Reactions   Nsaids Other (See Comments)   On Blood Thinners        Medication List        Accurate as of January 31, 2024  3:00 PM. If you have any questions, ask your nurse or doctor.          STOP taking these medications    ondansetron  4 MG tablet Commonly known as: ZOFRAN  Stopped by: Lauraine Pepper       TAKE these medications    acetaminophen  500 MG tablet Commonly known as: TYLENOL  Take 1 tablet (500 mg total) by mouth every 6 (six) hours as needed (pain).   apixaban  5 MG Tabs tablet Commonly known as: Eliquis  Take 1 tablet (5 mg total) by mouth 2 (two) times daily.   Blood Glucose Monitoring Suppl Devi 1 each by Does not apply route 3 (three) times daily as needed. May substitute to  any manufacturer covered by patient's insurance.   BLOOD GLUCOSE TEST STRIPS Strp 1 each by In Vitro route 4 (four) times daily as needed. May substitute to any manufacturer covered by patient's insurance.   clobetasol cream 0.05 % Commonly known as: TEMOVATE Apply topically 2 (two) times daily.   D3 2000 50 MCG (2000 UT) Caps Generic drug: Cholecalciferol Take by mouth.   esomeprazole 40 MG capsule Commonly known as: NEXIUM Take 40 mg by mouth daily at 12 noon.   irbesartan  300 MG tablet Commonly known as: AVAPRO  TAKE 1 TABLET(300 MG) BY MOUTH DAILY   magnesium gluconate 500 MG tablet Commonly known as: MAGONATE Take 500 mg by mouth daily.   mometasone 0.1 % ointment Commonly known as: ELOCON Apply topically.   multivitamin tablet Take 1 tablet by mouth daily.   multivitamin-lutein Caps capsule Take 1 capsule by mouth daily.   Myrbetriq  50 MG Tb24 tablet Generic drug: mirabegron  ER TAKE 1 TABLET(50 MG) BY MOUTH DAILY   Ozempic  (2 MG/DOSE) 8 MG/3ML Sopn Generic drug: Semaglutide  (2 MG/DOSE) INJECT 2 MG AS DIRECTED ONCE A WEEK.        Allergies:  Allergies  Allergen Reactions   Nsaids Other (See Comments)    On Blood Thinners    Past Medical History,  Surgical history, Social history, and Family History were reviewed and updated.  Review of Systems: All other 10 point review of systems is negative.   Physical Exam:  weight is 275 lb 6.4 oz (124.9 kg). Her oral temperature is 98 F (36.7 C). Her blood pressure is 128/59 (abnormal) and her pulse is 77. Her respiration is 18 and oxygen saturation is 100%.   Wt Readings from Last 3 Encounters:  01/31/24 275 lb 6.4 oz (124.9 kg)  09/12/23 280 lb 3.2 oz (127.1 kg)  09/11/23 280 lb (127 kg)    Ocular: Sclerae unicteric, pupils equal, round and reactive to light Ear-nose-throat: Oropharynx clear, dentition fair Lymphatic: No cervical or supraclavicular adenopathy Lungs no rales or rhonchi, good excursion  bilaterally Heart regular rate and rhythm, no murmur appreciated Abd soft, nontender, positive bowel sounds MSK no focal spinal tenderness, no joint edema Neuro: non-focal, well-oriented, appropriate affect Breasts: Deferred   Lab Results  Component Value Date   WBC 7.5 01/31/2024   HGB 11.8 (L) 01/31/2024   HCT 36.9 01/31/2024   MCV 81.6 01/31/2024   PLT 255 01/31/2024   Lab Results  Component Value Date   FERRITIN 21 09/14/2022   IRON 57 09/14/2022   TIBC 355 09/14/2022   IRONPCTSAT 16 09/14/2022   Lab Results  Component Value Date   RBC 4.52 01/31/2024   No results found for: KPAFRELGTCHN, LAMBDASER, KAPLAMBRATIO No results found for: IGGSERUM, IGA, IGMSERUM No results found for: STEPHANY CARLOTA BENSON MARKEL EARLA JOANNIE DOC VICK, SPEI   Chemistry      Component Value Date/Time   NA 143 07/31/2023 1307   K 3.8 07/31/2023 1307   CL 106 07/31/2023 1307   CO2 28 07/31/2023 1307   BUN 12 07/31/2023 1307   CREATININE 0.88 07/31/2023 1307   CREATININE 0.79 09/14/2022 0000      Component Value Date/Time   CALCIUM 8.9 07/31/2023 1307   ALKPHOS 84 07/31/2023 1307   AST 15 07/31/2023 1307   ALT 21 07/31/2023 1307   BILITOT 0.5 07/31/2023 1307       Impression and Plan:  Natalie Carney is a very pleasant 68 yo African American female with history of multiple thrombotic events and venous insufficiency. Continue same regimen with Eliquis  PO BID daily. Referral placed with PT for evaluation for lymphedema machine.  Follow-up in 6 months.   Lauraine Pepper, NP 2/7/20253:00 PM

## 2024-02-02 LAB — CARDIOLIPIN ANTIBODIES, IGG, IGM, IGA
Anticardiolipin IgA: <9 [APL'U]/mL (ref 0–11)
Anticardiolipin IgG: <9 [GPL'U]/mL (ref 0–14)
Anticardiolipin IgM: <9 [MPL'U]/mL (ref 0–12)

## 2024-02-11 ENCOUNTER — Other Ambulatory Visit: Payer: Self-pay | Admitting: Family

## 2024-02-11 DIAGNOSIS — I824Y9 Acute embolism and thrombosis of unspecified deep veins of unspecified proximal lower extremity: Secondary | ICD-10-CM

## 2024-02-11 DIAGNOSIS — M7989 Other specified soft tissue disorders: Secondary | ICD-10-CM

## 2024-02-21 ENCOUNTER — Other Ambulatory Visit: Payer: Self-pay | Admitting: Medical-Surgical

## 2024-03-01 ENCOUNTER — Other Ambulatory Visit: Payer: Self-pay | Admitting: Obstetrics and Gynecology

## 2024-03-01 DIAGNOSIS — N3941 Urge incontinence: Secondary | ICD-10-CM

## 2024-03-05 ENCOUNTER — Other Ambulatory Visit: Payer: Self-pay

## 2024-03-05 ENCOUNTER — Other Ambulatory Visit: Payer: Self-pay | Admitting: Medical-Surgical

## 2024-03-05 DIAGNOSIS — N3941 Urge incontinence: Secondary | ICD-10-CM

## 2024-03-05 MED ORDER — MIRABEGRON ER 50 MG PO TB24: 50.0000 mg | ORAL_TABLET | Freq: Every day | ORAL | 0 refills | Status: DC

## 2024-03-05 NOTE — Progress Notes (Signed)
 30 day supply of myrbetriq 50mg  sent to the pharmacy. Pt was scheduled for a 1 year f/u. Pt advised to keep her f/u appointment for more refills.

## 2024-03-11 ENCOUNTER — Encounter: Payer: Self-pay | Admitting: Medical-Surgical

## 2024-03-11 ENCOUNTER — Ambulatory Visit (INDEPENDENT_AMBULATORY_CARE_PROVIDER_SITE_OTHER): Payer: No Typology Code available for payment source | Admitting: Medical-Surgical

## 2024-03-11 VITALS — BP 130/80 | HR 80 | Temp 98.1°F | Ht 67.0 in | Wt 276.0 lb

## 2024-03-11 DIAGNOSIS — I2699 Other pulmonary embolism without acute cor pulmonale: Secondary | ICD-10-CM | POA: Insufficient documentation

## 2024-03-11 DIAGNOSIS — E118 Type 2 diabetes mellitus with unspecified complications: Secondary | ICD-10-CM

## 2024-03-11 DIAGNOSIS — E1169 Type 2 diabetes mellitus with other specified complication: Secondary | ICD-10-CM | POA: Insufficient documentation

## 2024-03-11 DIAGNOSIS — Z23 Encounter for immunization: Secondary | ICD-10-CM | POA: Diagnosis not present

## 2024-03-11 DIAGNOSIS — Z7901 Long term (current) use of anticoagulants: Secondary | ICD-10-CM

## 2024-03-11 DIAGNOSIS — Z7985 Long-term (current) use of injectable non-insulin antidiabetic drugs: Secondary | ICD-10-CM

## 2024-03-11 DIAGNOSIS — I10 Essential (primary) hypertension: Secondary | ICD-10-CM

## 2024-03-11 DIAGNOSIS — I824Y9 Acute embolism and thrombosis of unspecified deep veins of unspecified proximal lower extremity: Secondary | ICD-10-CM | POA: Diagnosis not present

## 2024-03-11 DIAGNOSIS — E785 Hyperlipidemia, unspecified: Secondary | ICD-10-CM

## 2024-03-11 LAB — POCT GLYCOSYLATED HEMOGLOBIN (HGB A1C): HbA1c, POC (controlled diabetic range): 5.6 % (ref 0.0–7.0)

## 2024-03-11 MED ORDER — APIXABAN 5 MG PO TABS: 5.0000 mg | ORAL_TABLET | Freq: Two times a day (BID) | ORAL | 2 refills | Status: DC

## 2024-03-11 MED ORDER — IRBESARTAN 300 MG PO TABS: ORAL_TABLET | ORAL | 3 refills | Status: AC

## 2024-03-11 NOTE — Progress Notes (Signed)
 Subjective:  Patient ID: Natalie Carney, female    DOB: 03/04/1956, 68 y.o.   MRN: [id number]  Patient Care Team: Christen Butter, NP as PCP - General (Nurse Practitioner)   Chief Complaint:  Medical Management of Chronic Issues (Last A1c is 5.9)   Natalie Carney is a 68 y.o. female presenting on 03/11/2024 for Medical Management of Chronic Issues (Last A1c is 5.9)   History, Exam,  Impression and Plan  1. Essential hypertension Pt taking ibesartan 300 mg PO daily without complications. Denies CP, SOB, palpitations, lower extremity edema, dizziness, headaches, or vision changes.  Does not check blood pressure at home.  Blood pressure is well-managed continue regimen. -     Refill and continue irbesartan (AVAPRO) 300 MG tablet; TAKE 1 TABLET(300 MG) BY MOUTH DAILY  2. Controlled type 2 diabetes mellitus with complication, without long-term current use of insulin (HCC) Patient taking semaglutide 2 mg SQ weekly without complications or side effects denies any nausea vomiting or diarrhea.  Denies any abdominal cramping.  Reports making healthier choices and limiting complex carbs and processed sugars. Hemoglobin A1c 5.6 today and was 5.9 last visit.  Diabetes well-managed at this time. Continue semaglutide. - POCT HgB A1C - POCT UA - Microalbumin  3. Morbid obesity (HCC) Patient taking semaglutide since 2022 without complications or side effects.  Concerned that she is unable to lose weight and that her weight loss has plateaued.  Patient inquires if it is possible to increase dose. Discussed change to [facility] when the patient has 2 doses of Ozempic left.   4. Immunization due (Primary) Patient due for and consented to pneumococcal vaccine.  Denies any previous allergies. - Pneumococcal conjugate vaccine 20-valent (Prevnar 20).  Patient recently had Shingrix No. 2 given at pharmacy.  Patient to forward documents for reconciliation.    5. Hyperlipidemia associated with type  2 diabetes mellitus (HCC) Patient has elevated history of elevated LDL of 114 on 09/14/2022.  Patient asking to have her cholesterol tested. -   Lipid panel  6. Chronic anticoagulation  7. Deep vein thrombosis (DVT) of proximal lower extremity, unspecified chronicity, unspecified laterality (HCC) 8. 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  Last pulmonary embolism was "years ago."  And last DVT was 06/15/2022.  History of failed anticoagulation therapy on Coumadin and Xarelto.   Distant History of chronic BID Lovenox but patient stopped in October 2021 and changed to Eliquis. Condition is managed by hematology.  Patient requesting refill of Eliquis until she can get into see hematology.  -     apixaban (ELIQUIS) 5 MG TABS tablet; Take 1 tablet (5 mg total) by mouth 2 (two) times daily.  Continue all other maintenance medications.  Follow up plan: Return in about 6 months (around 09/11/2024) for chronic disease follow up.   Relevant past medical, surgical, family, and social history reviewed and updated as indicated.  Allergies and medications reviewed and updated. Data reviewed: Chart in Epic.   Past Medical History:  Diagnosis Date   Abnormal mammogram    Arthritis    both knees   Colon polyp    Complication of anesthesia    DVT (deep venous thrombosis) (HCC)    several dvt's last dvt was 3 to 4 yrs ago per pt on 06-14-2022   GERD (gastroesophageal reflux disease)    Hypertension  Lymphedema    both legs uses compression machine at home and wears compression hose   PONV (postoperative nausea and vomiting)    post op ponv after myometcomy yrs ago   Posterior vaginal wall prolapse    Pulmonary embolism (HCC)    yrs ago   Type 2 Diabetes mellitus without complication (HCC)      Past Surgical History:  Procedure Laterality Date   ABDOMINAL HYSTERECTOMY  2005   KNEE SURGERY Right    arthroscopic right knee   LAPAROSCOPIC GASTRIC BANDING     MYOMECTOMY  2003   RECTOCELE REPAIR N/A 06/18/2022   Procedure: POSTERIOR REPAIR (RECTOCELE), ILLIOCOCCYGEAL SUSPENSION;  Surgeon: Marguerita Beards, MD;  Location: [facility];  Service: Gynecology;  Laterality: N/A;   SHOULDER SURGERY Left    x 2 yrs ago    Social History   Socioeconomic History   Marital status: Married    Spouse name: Not on file   Number of children: Not on file   Years of education: Not on file   Highest education level: Master's degree (e.g., MA, MS, MEng, MEd, MSW, MBA)  Occupational History   Not on file  Tobacco Use   Smoking status: Former    Current packs/day: 0.00    Types: Cigarettes    Quit date: 12/25/1987    Years since quitting: 36.2   Smokeless tobacco: Never   Tobacco comments:    Social smoker quit 1989  Vaping Use   Vaping status: Never Used  Substance and Sexual Activity   Alcohol use: Never   Drug use: Never   Sexual activity: Yes    Partners: Male    Birth control/protection: Surgical  Other Topics Concern   Not on file  Social History Narrative   Not on file   Social Drivers of Health   Financial Resource Strain: Low Risk  (03/10/2024)   Overall Financial Resource Strain (CARDIA)    Difficulty of Paying Living Expenses: Not hard at all  Food Insecurity: No Food Insecurity (03/10/2024)   Hunger Vital Sign    Worried About Running Out of Food in the Last Year: Never true    Ran Out of Food in the Last Year: Never true  Transportation Needs: No Transportation Needs (03/10/2024)   PRAPARE - Administrator, Civil Service (Medical): No    Lack of Transportation (Non-Medical): No  Physical Activity: Sufficiently Active (03/10/2024)   Exercise Vital Sign    Days of Exercise per Week: 4 days    Minutes of Exercise per Session: 60 min   Stress: No Stress Concern Present (03/10/2024)   [employer] of Occupational Health - Occupational Stress Questionnaire    Feeling of Stress : Only a little  Social Connections: Socially Integrated (03/10/2024)   Social Connection and Isolation Panel [NHANES]    Frequency of Communication with Friends and Family: More than three times a week    Frequency of Social Gatherings with Friends and Family: Three times a week    Attends Religious Services: More than 4 times per year    Active Member of Clubs or Organizations: Yes    Attends Banker Meetings: More than 4 times per year    Marital Status: Married  Catering manager Violence: Unknown (03/30/2022)   Received from [employer], [facility]   HITS    Physically Hurt: Not on file    Insult or Talk Down To: Not on file    Threaten Physical Harm:  Not on file    Scream or Curse: Not on file    Outpatient Encounter Medications as of 03/11/2024  Medication Sig   acetaminophen (TYLENOL) 500 MG tablet Take 1 tablet (500 mg total) by mouth every 6 (six) hours as needed (pain).   Blood Glucose Monitoring Suppl DEVI 1 each by Does not apply route 3 (three) times daily as needed. May substitute to any manufacturer covered by patient's insurance.   Cholecalciferol (D3 2000) 50 MCG (2000 UT) CAPS Take by mouth.   clobetasol cream (TEMOVATE) 0.05 % Apply topically 2 (two) times daily.   esomeprazole (NEXIUM) 40 MG capsule Take 40 mg by mouth daily at 12 noon.   Glucose Blood (BLOOD GLUCOSE TEST STRIPS) STRP 1 each by In Vitro route 4 (four) times daily as needed. May substitute to any manufacturer covered by patient's insurance.   magnesium gluconate (MAGONATE) 500 MG tablet Take 500 mg by mouth daily.   mirabegron ER (MYRBETRIQ) 50 MG TB24 tablet Take 1 tablet (50 mg total) by mouth daily.   mometasone (ELOCON) 0.1 % ointment Apply topically.   Multiple Vitamin (MULTIVITAMIN) tablet Take 1 tablet by mouth daily.    multivitamin-lutein (OCUVITE-LUTEIN) CAPS capsule Take 1 capsule by mouth daily.   Semaglutide, 2 MG/DOSE, (OZEMPIC, 2 MG/DOSE,) 8 MG/3ML SOPN INJECT 2 MG AS DIRECTED ONCE A WEEK.   [DISCONTINUED] apixaban (ELIQUIS) 5 MG TABS tablet Take 1 tablet (5 mg total) by mouth 2 (two) times daily.   [DISCONTINUED] irbesartan (AVAPRO) 300 MG tablet TAKE 1 TABLET(300 MG) BY MOUTH DAILY   apixaban (ELIQUIS) 5 MG TABS tablet Take 1 tablet (5 mg total) by mouth 2 (two) times daily.   irbesartan (AVAPRO) 300 MG tablet TAKE 1 TABLET(300 MG) BY MOUTH DAILY   No facility-administered encounter medications on file as of 03/11/2024.    Allergies  Allergen Reactions   Nsaids Other (See Comments)    On Blood Thinners    Review of Systems      Objective:  BP 130/80   Pulse 80   Temp 98.1 F (36.7 C) (Oral)   Ht 5\' 7"  (1.702 m)   Wt 276 lb (125.2 kg)   SpO2 99%   BMI 43.23 kg/m    Wt Readings from Last 3 Encounters:  03/11/24 276 lb (125.2 kg)  01/31/24 275 lb 6.4 oz (124.9 kg)  09/12/23 280 lb 3.2 oz (127.1 kg)    Physical Exam  Results for orders placed or performed in visit on 03/11/24  POCT HgB A1C   Collection Time: 03/11/24 10:20 AM  Result Value Ref Range   Hemoglobin A1C     HbA1c POC (<> result, manual entry)     HbA1c, POC (prediabetic range)     HbA1c, POC (controlled diabetic range) 5.6 0.0 - 7.0 %       Pertinent labs & imaging results that were available during my care of the patient were reviewed by me and considered in my medical decision making.   Continue healthy lifestyle choices, including diet (rich in fruits, vegetables, and lean proteins, and low in salt and simple carbohydrates) and exercise (at least 30 minutes of moderate physical activity daily).  The above assessment and management plan was discussed with the patient. The patient verbalized understanding of and has agreed to the management plan. Patient is aware to call the clinic if they develop any new  symptoms or if symptoms persist or worsen. Patient is aware when to return to the clinic for a  follow-up visit. Patient educated on when it is appropriate to go to the emergency department.   Maryelizabeth Kaufmann Student AGNP

## 2024-03-11 NOTE — Progress Notes (Signed)
 Medical screening examination/treatment was performed by qualified clinical staff member and as supervising provider I was immediately available for consultation/collaboration. I have reviewed documentation and agree with assessment and plan.  Thayer Ohm, DNP, APRN, FNP-BC [facility]

## 2024-03-12 ENCOUNTER — Encounter: Payer: Self-pay | Admitting: Medical-Surgical

## 2024-03-12 DIAGNOSIS — E118 Type 2 diabetes mellitus with unspecified complications: Secondary | ICD-10-CM

## 2024-03-12 LAB — LIPID PANEL
Chol/HDL Ratio: 4.5 ratio — ABNORMAL HIGH (ref 0.0–4.4)
Cholesterol, Total: 165 mg/dL (ref 100–199)
HDL: 37 mg/dL — ABNORMAL LOW (ref >39)
LDL Chol Calc (NIH): 107 mg/dL — ABNORMAL HIGH (ref 0–99)
Triglycerides: 118 mg/dL (ref 0–149)
VLDL Cholesterol Cal: 21 mg/dL (ref 5–40)

## 2024-03-16 MED ORDER — TIRZEPATIDE 10 MG/0.5ML ~~LOC~~ SOAJ: 10.0000 mg | SUBCUTANEOUS | 0 refills | Status: DC

## 2024-03-19 ENCOUNTER — Ambulatory Visit (INDEPENDENT_AMBULATORY_CARE_PROVIDER_SITE_OTHER): Admitting: Obstetrics and Gynecology

## 2024-03-19 ENCOUNTER — Encounter: Payer: Self-pay | Admitting: Obstetrics and Gynecology

## 2024-03-19 DIAGNOSIS — N3941 Urge incontinence: Secondary | ICD-10-CM | POA: Diagnosis not present

## 2024-03-19 MED ORDER — MIRABEGRON ER 50 MG PO TB24: 50.0000 mg | ORAL_TABLET | Freq: Every day | ORAL | 11 refills | Status: AC

## 2024-03-19 NOTE — Patient Instructions (Signed)
 Today we talked about ways to manage bladder urgency such as altering your diet to avoid irritative beverages and foods (bladder diet) as well as attempting to decrease stress and other exacerbating factors.    The Most Bothersome Foods* The Least Bothersome Foods*  Coffee - Regular & Decaf Tea - caffeinated Carbonated beverages - cola, non-colas, diet & caffeine-free Alcohols - Beer, Red Wine, White Wine, [address] - Grapefruit, [place], Orange, [employer] - Cranberry, Grapefruit, Orange, Pineapple Vegetables - Tomato & Tomato Products Flavor Enhancers - Hot peppers, Spicy foods, Chili, Horseradish, Vinegar, Monosodium glutamate (MSG) Artificial Sweeteners - NutraSweet, Sweet 'N Low, Equal (sweetener), Saccharin Ethnic foods - [place], [place], [place] food [employer] - low-fat & whole Fruits - Bananas, Blueberries, Honeydew melon, Pears, Raisins, Watermelon Vegetables - Broccoli, [address] Sprouts, [place], Carrots, Cauliflower, [place], Cucumber, Mushrooms, Peas, Radishes, Squash, Zucchini, White potatoes, Sweet potatoes & yams Poultry - Chicken, Eggs, [place], [employer] - Beef, Diplomatic Services operational officer, Lamb Seafood - Shrimp, [place] fish, Salmon Grains - Oat, Rice Snacks - Pretzels, Popcorn  *Lenward Chancellor et al. Diet and its role in interstitial cystitis/bladder pain syndrome (IC/BPS) and comorbid conditions. [employer]. BJU Int. 2012 Jan 11.     Restart benefiber to help prevent loose stools or constipation. This can also help prevent bowel leakage.

## 2024-03-19 NOTE — Progress Notes (Signed)
 [facility]  Date of Visit: 03/19/2024  History of Present Illness: [name] is a 68 y.o. female presenting today for follow up of OAB.   Surgery: s/p Posterior repair, Ileococcygeal suspension on 06/18/22  Has been taking Myrbetriq 50mg  daily. Has occasional leakage on the way to the bathroom with urgency- most days are good but has some bad days.   Drinks about 32oz water and 48oz soda per day.   Had one episode of bowel leakage in Jan- she did not have a place to go to the bathroom and was trying to hold it, stool was looser. She is no longer taking the benefiber.   She has been on ozempic but will start Monjuaro soon. That has decreased her A1c.    Medications: She has a current medication list which includes the following prescription(s): acetaminophen, apixaban, blood glucose monitoring suppl, d3 2000, clobetasol cream, esomeprazole, blood glucose test strips, irbesartan, magnesium gluconate, mometasone, multivitamin, multivitamin-lutein, valsartan, mirabegron er, and tirzepatide.   Allergies: Patient is allergic to nsaids.   Physical Exam: BP (!) 148/80   Pulse 75    Gen: AAOx 3  POP-Q (08/01/22): POP-Q   -3                                            Aa   -3                                           Ba   -8                                              C    2.5                                            Gh   4                                            Pb   8                                            tvl    -3                                            Ap   -3                                            Bp  D        ---------------------------------------------------------  Assessment and Plan:  1. Urge incontinence     - refill provided for Myrbetriq 50mg  daily.  - Discussed avoiding soda and other bladder irritants. Reviewed third line options of intravesical botox, sacral nerve  stimulation and tibial nerve stimulation.  - Restart benefiber for stool bulking - Follow up 1 year or sooner if needed. Ok for PCP to refill Myrbetriq in the future if symptoms are stable.   Marguerita Beards, MD  Time spent: I spent 20 minutes dedicated to the care of this patient on the date of this encounter to include pre-visit review of records, face-to-face time with the patient and post visit documentation and ordering medication/ testing.

## 2024-03-20 ENCOUNTER — Ambulatory Visit: Admitting: Obstetrics and Gynecology

## 2024-05-09 ENCOUNTER — Other Ambulatory Visit: Payer: Self-pay | Admitting: Medical-Surgical

## 2024-05-09 DIAGNOSIS — E118 Type 2 diabetes mellitus with unspecified complications: Secondary | ICD-10-CM

## 2024-06-03 ENCOUNTER — Encounter: Payer: Self-pay | Admitting: Physician Assistant

## 2024-06-03 ENCOUNTER — Ambulatory Visit (INDEPENDENT_AMBULATORY_CARE_PROVIDER_SITE_OTHER): Admitting: Physician Assistant

## 2024-06-03 ENCOUNTER — Ambulatory Visit

## 2024-06-03 ENCOUNTER — Ambulatory Visit: Payer: Self-pay | Admitting: Physician Assistant

## 2024-06-03 VITALS — BP 116/51 | HR 67 | Resp 18 | Ht 67.0 in | Wt 280.0 lb

## 2024-06-03 DIAGNOSIS — R053 Chronic cough: Secondary | ICD-10-CM

## 2024-06-03 MED ORDER — PROMETHAZINE-DM 6.25-15 MG/5ML PO SYRP: 5.0000 mL | ORAL_SOLUTION | Freq: Four times a day (QID) | ORAL | 0 refills | Status: DC | PRN

## 2024-06-03 NOTE — Progress Notes (Signed)
 Lungs and heart look great!

## 2024-06-03 NOTE — Progress Notes (Signed)
   Acute Office Visit  Subjective:     Patient ID: Natalie Carney, female    DOB: [birth year], 68 y.o.   MRN: [id number]  Chief Complaint  Patient presents with   Medical Management of Chronic Issues    congestion  and xray    HPI Patient is in today for chronic cough. She has had intermittent chronic dry cough since she had covid in 2022. She denies any SOB or wheezing. No fever or chills. Her cough is worse at night and effects her sleep. She mentions having to prop her pillows up at night to sleep. She denies any lower ext swelling or SOB with exertion.  She has seen ENT and started on PPI to see if that would help. She suggested she also get CXR. Pt does have hx of DVT and on anticoagulation.   ROS See HPI.      Objective:    BP (!) 116/51 (BP Location: Left Arm, Patient Position: Sitting, Cuff Size: Large)   Pulse 67   Resp 18   Ht 5' 7 (1.702 m)   Wt 280 lb (127 kg)   SpO2 99%   BMI 43.85 kg/m  BP Readings from Last 3 Encounters:  06/03/24 (!) 116/51  03/19/24 (!) 148/80  03/11/24 130/80   Wt Readings from Last 3 Encounters:  06/03/24 280 lb (127 kg)  03/11/24 276 lb (125.2 kg)  01/31/24 275 lb 6.4 oz (124.9 kg)      Physical Exam Vitals reviewed.  Constitutional:      Appearance: Normal appearance. She is obese.  HENT:     Head: Normocephalic.   Cardiovascular:     Rate and Rhythm: Normal rate and regular rhythm.     Pulses: Normal pulses.     Heart sounds: Normal heart sounds.  Pulmonary:     Effort: Pulmonary effort is normal.     Breath sounds: Normal breath sounds.   Musculoskeletal:     Cervical back: Normal range of motion and neck supple. No tenderness.     Right lower leg: No edema.     Left lower leg: No edema.  Lymphadenopathy:     Cervical: No cervical adenopathy.   Neurological:     General: No focal deficit present.     Mental Status: She is alert and oriented to person, place, and time.   Psychiatric:        Mood and  Affect: Mood normal.          Assessment & Plan:  Aaron AasAaron AasMariapaula was seen today for medical management of chronic issues.  Diagnoses and all orders for this visit:  Chronic cough -     DG Chest 2 View; Future -     promethazine -dextromethorphan (PROMETHAZINE -DM) 6.25-15 MG/5ML syrup; Take 5 mLs by mouth 4 (four) times daily as needed.   PE no concerning signs CXR ordered today Continue PPI and give it at least 2 weeks Cough syrup given to use at bedtime as needed to help with cough and sleep Consider zyrtec/claritin for chronic cough Consider echo in future if cough persist and no other causes found  Sandy Crumb, PA-C

## 2024-06-03 NOTE — Progress Notes (Deleted)
 Error

## 2024-06-03 NOTE — Patient Instructions (Signed)
 Never had echo could consider if no improvement with acid reducer and normal CXR. Cough syrup to use as needed.

## 2024-06-05 DIAGNOSIS — R053 Chronic cough: Secondary | ICD-10-CM | POA: Insufficient documentation

## 2024-07-22 ENCOUNTER — Other Ambulatory Visit: Payer: Self-pay | Admitting: Medical-Surgical

## 2024-07-22 DIAGNOSIS — Z1231 Encounter for screening mammogram for malignant neoplasm of breast: Secondary | ICD-10-CM

## 2024-07-29 ENCOUNTER — Encounter: Payer: Self-pay | Admitting: Medical Oncology

## 2024-07-29 ENCOUNTER — Inpatient Hospital Stay: Attending: Family

## 2024-07-29 ENCOUNTER — Inpatient Hospital Stay (HOSPITAL_BASED_OUTPATIENT_CLINIC_OR_DEPARTMENT_OTHER): Admitting: Medical Oncology

## 2024-07-29 VITALS — BP 146/66 | HR 53 | Temp 98.0°F | Resp 18 | Ht 67.0 in | Wt 279.4 lb

## 2024-07-29 DIAGNOSIS — I872 Venous insufficiency (chronic) (peripheral): Secondary | ICD-10-CM | POA: Diagnosis present

## 2024-07-29 DIAGNOSIS — I2699 Other pulmonary embolism without acute cor pulmonale: Secondary | ICD-10-CM

## 2024-07-29 DIAGNOSIS — I824Y9 Acute embolism and thrombosis of unspecified deep veins of unspecified proximal lower extremity: Secondary | ICD-10-CM

## 2024-07-29 DIAGNOSIS — Z86718 Personal history of other venous thrombosis and embolism: Secondary | ICD-10-CM | POA: Insufficient documentation

## 2024-07-29 DIAGNOSIS — Z86711 Personal history of pulmonary embolism: Secondary | ICD-10-CM | POA: Insufficient documentation

## 2024-07-29 DIAGNOSIS — Z7901 Long term (current) use of anticoagulants: Secondary | ICD-10-CM | POA: Diagnosis not present

## 2024-07-29 DIAGNOSIS — D509 Iron deficiency anemia, unspecified: Secondary | ICD-10-CM | POA: Insufficient documentation

## 2024-07-29 LAB — CBC WITH DIFFERENTIAL (CANCER CENTER ONLY)
Abs Immature Granulocytes: 0.01 K/uL (ref 0.00–0.07)
Basophils Absolute: 0 K/uL (ref 0.0–0.1)
Basophils Relative: 1 %
Eosinophils Absolute: 0.2 K/uL (ref 0.0–0.5)
Eosinophils Relative: 3 %
HCT: 37.4 % (ref 36.0–46.0)
Hemoglobin: 11.7 g/dL — ABNORMAL LOW (ref 12.0–15.0)
Immature Granulocytes: 0 %
Lymphocytes Relative: 39 %
Lymphs Abs: 2.9 K/uL (ref 0.7–4.0)
MCH: 25.6 pg — ABNORMAL LOW (ref 26.0–34.0)
MCHC: 31.3 g/dL (ref 30.0–36.0)
MCV: 81.8 fL (ref 80.0–100.0)
Monocytes Absolute: 0.5 K/uL (ref 0.1–1.0)
Monocytes Relative: 7 %
Neutro Abs: 3.7 K/uL (ref 1.7–7.7)
Neutrophils Relative %: 50 %
Platelet Count: 242 K/uL (ref 150–400)
RBC: 4.57 MIL/uL (ref 3.87–5.11)
RDW: 14.1 % (ref 11.5–15.5)
WBC Count: 7.3 K/uL (ref 4.0–10.5)
nRBC: 0 % (ref 0.0–0.2)

## 2024-07-29 LAB — CMP (CANCER CENTER ONLY)
ALT: 31 U/L (ref 0–44)
AST: 29 U/L (ref 15–41)
Albumin: 3.9 g/dL (ref 3.5–5.0)
Alkaline Phosphatase: 97 U/L (ref 38–126)
Anion gap: 12 (ref 5–15)
BUN: 14 mg/dL (ref 8–23)
CO2: 25 mmol/L (ref 22–32)
Calcium: 9.4 mg/dL (ref 8.9–10.3)
Chloride: 105 mmol/L (ref 98–111)
Creatinine: 0.93 mg/dL (ref 0.44–1.00)
GFR, Estimated: >60 mL/min (ref >60)
Glucose, Bld: 116 mg/dL — ABNORMAL HIGH (ref 70–99)
Potassium: 4 mmol/L (ref 3.5–5.1)
Sodium: 141 mmol/L (ref 135–145)
Total Bilirubin: 0.4 mg/dL (ref 0.0–1.2)
Total Protein: 7.2 g/dL (ref 6.5–8.1)

## 2024-07-29 LAB — D-DIMER, QUANTITATIVE: D-Dimer, Quant: 0.3 ug{FEU}/mL (ref 0.00–0.50)

## 2024-07-29 MED ORDER — DABIGATRAN ETEXILATE MESYLATE 150 MG PO CAPS: 150.0000 mg | ORAL_CAPSULE | Freq: Two times a day (BID) | ORAL | 11 refills | Status: DC

## 2024-07-29 NOTE — Progress Notes (Signed)
 Hematology and Oncology Follow Up Visit  Natalie Carney [id number] 12/04/1956 68 y.o. 07/29/2024   Principle Diagnosis:  History of multiple thrombotic events secondary to venous insufficiency    Past Therapy: Failed Coumadin, Xarelto, Eliquis  - recurrence     Current Therapy:        Eliquis  5 mg PO BID    Interim History:  Natalie Carney is here today for follow-up.  When reviewing her history she reports that she had failed Eliquis  previously and was put on Lovenox  which worked but after a year she declined treatment. She was placed back on Eliquis  and since then has done ok without recurrence.  No redness or edema at this time. Pedal pulses are 1+.  No fever, chills, n/v, cough, rash, dizziness, SOB, chest pain, palpitations, abdominal pain or changes in bowel or bladder habits.  She eats prunes and drinks prune juice to help with chronic constipation.  She has not noted any blood loss. No abnormal bruising, no petechiae.  No falls or syncope reported.  Appetite and hydration are good.  Wt Readings from Last 3 Encounters:  07/29/24 279 lb 6.4 oz (126.7 kg)  06/03/24 280 lb (127 kg)  03/11/24 276 lb (125.2 kg)     ECOG Performance Status: 1 - Symptomatic but completely ambulatory  Medications:  Allergies as of 07/29/2024       Reactions   Nsaids Other (See Comments)   On Blood Thinners        Medication List        Accurate as of July 29, 2024  1:54 PM. If you have any questions, ask your nurse or doctor.          STOP taking these medications    clobetasol cream 0.05 % Commonly known as: TEMOVATE Stopped by: Lauraine CHRISTELLA Dais   esomeprazole 40 MG capsule Commonly known as: NEXIUM Stopped by: Lauraine CHRISTELLA Dais   VALSARTAN PO Stopped by: Lauraine CHRISTELLA Dais       TAKE these medications    acetaminophen  500 MG tablet Commonly known as: TYLENOL  Take 1 tablet (500 mg total) by mouth every 6 (six) hours as needed (pain).   apixaban  5 MG  Tabs tablet Commonly known as: Eliquis  Take 1 tablet (5 mg total) by mouth 2 (two) times daily.   Blood Glucose Monitoring Suppl Devi 1 each by Does not apply route 3 (three) times daily as needed. May substitute to any manufacturer covered by patient's insurance.   BLOOD GLUCOSE TEST STRIPS Strp 1 each by In Vitro route 4 (four) times daily as needed. May substitute to any manufacturer covered by patient's insurance.   D3 2000 50 MCG (2000 UT) Caps Generic drug: Cholecalciferol Take by mouth.   irbesartan  300 MG tablet Commonly known as: AVAPRO  TAKE 1 TABLET(300 MG) BY MOUTH DAILY   magnesium gluconate 500 MG tablet Commonly known as: MAGONATE Take 500 mg by mouth daily.   mirabegron  ER 50 MG Tb24 tablet Commonly known as: Myrbetriq  Take 1 tablet (50 mg total) by mouth daily.   mometasone 0.1 % ointment Commonly known as: ELOCON Apply topically.   Mounjaro  10 MG/0.5ML Pen Generic drug: tirzepatide  INJECT 10 MG INTO THE SKIN ONE TIME PER WEEK   multivitamin tablet Take 1 tablet by mouth daily.   multivitamin-lutein Caps capsule Take 1 capsule by mouth daily.   promethazine -dextromethorphan 6.25-15 MG/5ML syrup Commonly known as: PROMETHAZINE -DM Take 5 mLs by mouth 4 (four) times daily as needed.  Allergies:  Allergies  Allergen Reactions   Nsaids Other (See Comments)    On Blood Thinners    Past Medical History, Surgical history, Social history, and Family History were reviewed and updated.  Review of Systems: All other 10 point review of systems is negative.   Physical Exam:  height is 5' 7 (1.702 m) and weight is 279 lb 6.4 oz (126.7 kg). Her oral temperature is 98 F (36.7 C). Her blood pressure is 146/66 (abnormal) and her pulse is 53 (abnormal). Her respiration is 18 and oxygen saturation is 100%.   Wt Readings from Last 3 Encounters:  07/29/24 279 lb 6.4 oz (126.7 kg)  06/03/24 280 lb (127 kg)  03/11/24 276 lb (125.2 kg)    Ocular:  Sclerae unicteric, pupils equal, round and reactive to light Ear-nose-throat: Oropharynx clear, dentition fair Lymphatic: No cervical or supraclavicular adenopathy Lungs no rales or rhonchi, good excursion bilaterally Heart regular rate and rhythm, no murmur appreciated Abd soft Neuro: non-focal, well-oriented, appropriate affect   Lab Results  Component Value Date   WBC 7.3 07/29/2024   HGB 11.7 (L) 07/29/2024   HCT 37.4 07/29/2024   MCV 81.8 07/29/2024   PLT 242 07/29/2024   Lab Results  Component Value Date   FERRITIN 21 09/14/2022   IRON 57 09/14/2022   TIBC 355 09/14/2022   IRONPCTSAT 16 09/14/2022   Lab Results  Component Value Date   RBC 4.57 07/29/2024   No results found for: KPAFRELGTCHN, LAMBDASER, KAPLAMBRATIO No results found for: IGGSERUM, IGA, IGMSERUM No results found for: STEPHANY CARLOTA BENSON MARKEL EARLA JOANNIE DOC VICK, SPEI   Chemistry      Component Value Date/Time   NA 141 01/31/2024 1447   K 4.0 01/31/2024 1447   CL 107 01/31/2024 1447   CO2 28 01/31/2024 1447   BUN 9 01/31/2024 1447   CREATININE 0.84 01/31/2024 1447   CREATININE 0.79 09/14/2022 0000      Component Value Date/Time   CALCIUM 9.1 01/31/2024 1447   ALKPHOS 78 01/31/2024 1447   AST 18 01/31/2024 1447   ALT 21 01/31/2024 1447   BILITOT 0.4 01/31/2024 1447     Encounter Diagnoses  Name Primary?   Chronic anticoagulation Yes   Deep vein thrombosis (DVT) of proximal lower extremity, unspecified chronicity, unspecified laterality (HCC)    Pulmonary embolism, unspecified chronicity, unspecified pulmonary embolism type, unspecified whether acute cor pulmonale present (HCC)    Impression and Plan:  Natalie Carney is a very pleasant 68 yo African American female with history of multiple thrombotic events and venous insufficiency. 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 She also has a history of IDA. She has been treated with IV iron  previously when she lived in [place].   CBC is stable without signs of new anemia. 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 GI referral encouraged which she is already planning for her IDA.  CMP pending Cardiolipin antibodies repeat testing is pending at this time Switching her from Eliquis  to Pradaxa  given hsitory of failing Eliquis .   RTC 6 months APP, labs (CBC, CMP)  Lauraine CHRISTELLA Dais, PA-C 8/6/20251:54 PM

## 2024-07-30 ENCOUNTER — Ambulatory Visit: Payer: Self-pay | Admitting: Medical Oncology

## 2024-07-31 ENCOUNTER — Ambulatory Visit: Payer: No Typology Code available for payment source | Admitting: Family

## 2024-07-31 ENCOUNTER — Inpatient Hospital Stay: Payer: Medicare Other

## 2024-07-31 LAB — CARDIOLIPIN ANTIBODIES, IGG, IGM, IGA
Anticardiolipin IgA: <9 U/mL (ref 0–11)
Anticardiolipin IgG: 19 GPL U/mL — ABNORMAL HIGH (ref 0–14)
Anticardiolipin IgM: <9 [MPL'U]/mL (ref 0–12)

## 2024-08-08 ENCOUNTER — Other Ambulatory Visit: Payer: Self-pay | Admitting: Medical-Surgical

## 2024-08-08 DIAGNOSIS — E118 Type 2 diabetes mellitus with unspecified complications: Secondary | ICD-10-CM

## 2024-08-11 NOTE — Therapy (Addendum)
 OUTPATIENT PHYSICAL THERAPY LYMPHEDEMA EVALUATION  Patient Name: Natalie Carney MRN: [id number] DOB:[birth year], 68 y.o., female Today's Date: 08/12/2024  END OF SESSION:  PT End of Session - 08/12/24 1442     Visit Number 1    Number of Visits 1    PT Start Time 1355    PT Stop Time 1450    PT Time Calculation (min) 55 min    Activity Tolerance Patient tolerated treatment well    Behavior During Therapy WFL for tasks assessed/performed          Past Medical History:  Diagnosis Date   Abnormal mammogram    Arthritis    both knees   Colon polyp    Complication of anesthesia    DVT (deep venous thrombosis) (HCC)    several dvt's last dvt was 3 to 4 yrs ago per pt on 06-14-2022   GERD (gastroesophageal reflux disease)    Hypertension    Lymphedema    both legs uses compression machine at home and wears compression hose   PONV (postoperative nausea and vomiting)    post op ponv after myometcomy yrs ago   Posterior vaginal wall prolapse    Pulmonary embolism (HCC)    yrs ago   Type 2 Diabetes mellitus without complication (HCC)    Past Surgical History:  Procedure Laterality Date   ABDOMINAL HYSTERECTOMY  2005   KNEE SURGERY Right    arthroscopic right knee   LAPAROSCOPIC GASTRIC BANDING     MYOMECTOMY  2003   RECTOCELE REPAIR N/A 06/18/2022   Procedure: POSTERIOR REPAIR (RECTOCELE), ILLIOCOCCYGEAL SUSPENSION;  Surgeon: Marilynne Rosaline SAILOR, MD;  Location: [facility];  Service: Gynecology;  Laterality: N/A;   SHOULDER SURGERY Left    x 2 yrs ago   Patient Active Problem List   Diagnosis Date Noted   Chronic cough 06/05/2024   Immunization due 03/11/2024   Hyperlipidemia associated with type 2 diabetes mellitus (HCC) 03/11/2024   Deep vein thrombosis (DVT) of proximal lower extremity (HCC) 03/11/2024   Pulmonary embolism (HCC) 03/11/2024   Morbid obesity (HCC) 09/12/2023   Hematuria 04/03/2022   Urge incontinence 04/03/2022   Prolapse of  posterior vaginal wall 04/03/2022   Chronic anticoagulation 04/11/2021   Essential hypertension 04/11/2021   Controlled type 2 diabetes mellitus with complication, without long-term current use of insulin (HCC) 04/11/2021   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 states that she has had DVT in 1986, 1990 and 1995.  She has failed on all the oral medication to assist in stopping the blood clots.  Pt states that she has not had a pump in 10 years as the pump broke.  Pt states that she gets wounds on her legs approximately 4 or more times a year.  Currently she is wound free.  She is wearing 20-30 mm hg compression, completes ankle pumps and LAQ as well as elevates her legs and has done so for over a year.      PERTINENT HISTORY:  History of multiple thrombotic events secondary to venous insufficiency , DM, HTN    PAIN:  Are you having pain? Yes NPRS scale: 6/10 can go as a 10+  Pain location: Rt greater than left  Pain orientation: Right and Left  PAIN TYPE: aching and throbbing Pain description: constant  Aggravating factors: activity  Relieving factors: elevating le   PRECAUTIONS: History of multiple thrombotic events secondary to venous insufficiency      WEIGHT BEARING RESTRICTIONS: No  FALLS:  Has patient fallen in last 6 months? No  LIVING ENVIRONMENT: Lives with: lives with their family  OCCUPATION: n/a  LEISURE: walk  PRIOR LEVEL OF FUNCTION: Independent  PATIENT GOALS: decreased clots, legs to have less swelling     OBJECTIVE: Note: Objective measures were completed at Evaluation unless otherwise noted.  COGNITION: Overall cognitive status: Within functional limits for tasks assessed   PALPATION: Edema no induration, noted varicosities of vein   OBSERVATIONS / OTHER ASSESSMENTS: negative stemmer sign , skin is darkened.   POSTURE: wfl   LYMPHEDEMA ASSESSMENTS:     LE LANDMARK RIGHT eval  At groin   30 cm proximal to suprapatella   20 cm proximal to suprapatella   10 cm proximal to suprapatella   At midpatella / popliteal crease   30 cm proximal to floor at lateral plantar foot 45.1  20 cm proximal to floor at lateral plantar foot 31.5  10 cm proximal to floor at lateral plantar foot 26.3  Circumference of ankle/heel 35.5  5 cm proximal to 1st MTP joint 24.7  Across MTP joint 24.3  Around proximal great toe   (Blank rows = not tested)  LE LANDMARK LEFT eval  At groin   30 cm proximal to suprapatella   20 cm proximal to suprapatella   10 cm proximal to suprapatella   At midpatella / popliteal crease   30 cm proximal to floor at lateral plantar foot 46.7  20 cm proximal to floor at lateral plantar foot 34  10 cm proximal to floor at lateral plantar foot 26.8  Circumference of ankle/heel 35.4  5 cm proximal to 1st MTP joint 25.224.2  Across MTP joint   Around proximal great toe   (Blank rows = not tested)      TODAY'S TREATMENT:                                                                                                                              DATE: 08/12/24 Evaluation and education for HEP, pump and 20-30 mm hg surgical wt compression garment.   PATIENT EDUCATION:  Education details:  HEP, pump and 20-30 mm hg surgical wt compression garment.   Person educated: Patient Education method: Explanation, Demonstration, Verbal cues, and Handouts Education comprehension: verbalized understanding and returned demonstration  HOME EXERCISE PROGRAM: Access Code:  [email or web address] URL: [email or web address] Date: 08/12/2024 Prepared by: Montie Metro  Exercises - Seated Diaphragmatic Breathing  - 1 x daily -  7 x weekly - 10 reps - 5 hold - Seated Cervical Sidebending AROM  - 1 x daily - 7 x weekly - 1 sets - 10 reps - 2-3 hold - Seated Cervical Rotation AROM  - 1 x daily - 7 x weekly - 1 sets - 10 reps - 2-3 hold - Seated Cervical Extension AROM  - 1 x daily - 7 x weekly - 1 sets - 10 reps - 2-3 hold - Seated Cervical Retraction  - 1 x daily - 7 x weekly - 1 sets - 10 reps - 2-3 hold - Shoulder Rolls in Sitting  - 1 x daily - 7 x weekly - 1 sets - 10 reps - 2-3 hold - Seated Sidebending Arms Overhead  - 1 x daily - 7 x weekly - 1 sets - 10 reps - 2-3 hold - Seated Hip Abduction  - 1 x daily - 7 x weekly - 1 sets - 10 reps - 2-3 hold - Seated Long Arc Quad  - 1 x daily - 7 x weekly - 1 sets - 10 reps - 2-3 hold - Seated Heel Toe Raises  - 1 x daily - 7 x weekly - 1 sets - 10 reps - 2-3 hold - Seated Toe Curl  - 1 x daily - 7 x weekly - 1 sets - 10 reps - 2-3 hold  ASSESSMENT:  CLINICAL IMPRESSION: Patient is a 68 y.o. female who was seen today for physical therapy evaluation and treatment forM79.89 (ICD-10-CM) - Leg swelling I82.4Y9 (ICD-10-CM) - Deep vein thrombosis (DVT) of proximal lower extremity, unspecified chronicity, unspecified laterality (HCC) .     OBJECTIVE IMPAIRMENTS: increased edema.    PERSONAL FACTORS: Fitness and 1-2 comorbidities: DM, obesity  are also affecting patient's functional outcome.   REHAB POTENTIAL: Fair    CLINICAL DECISION MAKING: Stable/uncomplicated  EVALUATION COMPLEXITY: Moderate  GOALS: Goals reviewed with patient? Yes  SHORT TERM GOALS: Target date: 09/02/24  Pt is I in completing HEP daily Baseline: Goal status: INITIAL  2.  PT to be pumping daily Baseline:  Goal status: INITIAL  3.  PT to be wearing compression garments daily Baseline:  Goal status:  INITIAL     PLAN:  PT FREQUENCY: 1x/week  PT DURATION: 1 week  PLANNED INTERVENTIONS: 97110-Therapeutic exercises and 02464- Self Care  PLAN FOR NEXT SESSION: one time visit.    Montie Metro, PT CLT [phone]  08/12/2024, 2:56 PM

## 2024-08-12 ENCOUNTER — Other Ambulatory Visit: Payer: Self-pay

## 2024-08-12 ENCOUNTER — Ambulatory Visit (HOSPITAL_COMMUNITY): Attending: Family | Admitting: Physical Therapy

## 2024-08-12 DIAGNOSIS — I824Y9 Acute embolism and thrombosis of unspecified deep veins of unspecified proximal lower extremity: Secondary | ICD-10-CM | POA: Diagnosis not present

## 2024-08-12 DIAGNOSIS — M7989 Other specified soft tissue disorders: Secondary | ICD-10-CM | POA: Insufficient documentation

## 2024-08-12 DIAGNOSIS — R600 Localized edema: Secondary | ICD-10-CM | POA: Insufficient documentation

## 2024-08-12 DIAGNOSIS — M79604 Pain in right leg: Secondary | ICD-10-CM | POA: Diagnosis present

## 2024-08-12 DIAGNOSIS — I872 Venous insufficiency (chronic) (peripheral): Secondary | ICD-10-CM | POA: Insufficient documentation

## 2024-08-14 ENCOUNTER — Encounter (HOSPITAL_COMMUNITY)

## 2024-08-17 ENCOUNTER — Encounter (HOSPITAL_COMMUNITY): Admitting: Physical Therapy

## 2024-08-19 ENCOUNTER — Ambulatory Visit (INDEPENDENT_AMBULATORY_CARE_PROVIDER_SITE_OTHER)

## 2024-08-19 ENCOUNTER — Encounter (HOSPITAL_COMMUNITY): Admitting: Physical Therapy

## 2024-08-19 ENCOUNTER — Ambulatory Visit

## 2024-08-19 DIAGNOSIS — Z1231 Encounter for screening mammogram for malignant neoplasm of breast: Secondary | ICD-10-CM

## 2024-08-21 ENCOUNTER — Encounter (HOSPITAL_COMMUNITY): Admitting: Physical Therapy

## 2024-08-25 ENCOUNTER — Ambulatory Visit: Payer: Self-pay | Admitting: Medical-Surgical

## 2024-09-10 ENCOUNTER — Encounter: Payer: Self-pay | Admitting: Medical-Surgical

## 2024-09-10 ENCOUNTER — Ambulatory Visit (INDEPENDENT_AMBULATORY_CARE_PROVIDER_SITE_OTHER): Admitting: Medical-Surgical

## 2024-09-10 VITALS — BP 128/71 | HR 67 | Resp 20 | Ht 67.0 in | Wt 275.0 lb

## 2024-09-10 DIAGNOSIS — K5900 Constipation, unspecified: Secondary | ICD-10-CM | POA: Insufficient documentation

## 2024-09-10 DIAGNOSIS — E118 Type 2 diabetes mellitus with unspecified complications: Secondary | ICD-10-CM

## 2024-09-10 DIAGNOSIS — Z1211 Encounter for screening for malignant neoplasm of colon: Secondary | ICD-10-CM

## 2024-09-10 DIAGNOSIS — I10 Essential (primary) hypertension: Secondary | ICD-10-CM | POA: Diagnosis not present

## 2024-09-10 DIAGNOSIS — E1169 Type 2 diabetes mellitus with other specified complication: Secondary | ICD-10-CM

## 2024-09-10 DIAGNOSIS — Z23 Encounter for immunization: Secondary | ICD-10-CM

## 2024-09-10 DIAGNOSIS — E785 Hyperlipidemia, unspecified: Secondary | ICD-10-CM

## 2024-09-10 LAB — POCT UA - MICROALBUMIN
Creatinine, POC: 300 mg/dL
Microalbumin Ur, POC: 80 mg/L

## 2024-09-10 LAB — POCT GLYCOSYLATED HEMOGLOBIN (HGB A1C)
HbA1c, POC (controlled diabetic range): 5.6 % (ref 0.0–7.0)
Hemoglobin A1C: 5.6 % (ref 4.0–5.6)

## 2024-09-10 MED ORDER — TIRZEPATIDE 12.5 MG/0.5ML ~~LOC~~ SOAJ: 12.5000 mg | SUBCUTANEOUS | 0 refills | Status: DC

## 2024-09-10 NOTE — Assessment & Plan Note (Signed)
 Weight management challenging. Patient desires improved outcomes. - Increase Mounjaro  to 12.5 mg for weight loss. - Encourage increased protein intake through dietary modifications.

## 2024-09-10 NOTE — Assessment & Plan Note (Signed)
 Hypertension managed with irbesartan  300mg  daily, effective and well tolerated. Denies concerning symptoms.  - BP at goal.  - Continue irbesartan  as prescribed.

## 2024-09-10 NOTE — Progress Notes (Signed)
 Established patient visit   History of Present Illness   Discussed the use of AI scribe software for clinical note transcription with the patient, who gave verbal consent to proceed.  History of Present Illness   Natalie Carney is a 68 year old female with type 2 diabetes who presents for a follow-up visit.  Glycemic control - Type 2 diabetes mellitus managed with Mounjaro  10 mg daily - Fasting glucose ranges from 99 to 111 mg/dL - Postprandial glucose up to 134 mg/dL - Hemoglobin J8r stable at 5.6% in March - Mounjaro  provides effective blood sugar control but has not been helpful with weight management  Weight management - Persistent difficulty with weight loss despite dietary modifications and use of Mounjaro  - Engages in daily 45-minute walks - No weight loss achieved with current regimen  Dietary habits - Low protein intake with limited red meat consumption - Diet consists of chicken, [place], fish, and occasional beans - Open to increasing protein intake, including options such as [place] yogurt - Occasionally reads nutrition labels  Constipation - Chronic constipation with minimal relief from daily use of 30 mL Miralax  powder - No use of other medications specifically for chronic constipation     Physical Exam   Physical Exam Vitals reviewed.  Constitutional:      General: She is not in acute distress.    Appearance: Normal appearance. She is obese. She is not ill-appearing.  HENT:     Head: Normocephalic and atraumatic.  Cardiovascular:     Rate and Rhythm: Normal rate and regular rhythm.     Pulses: Normal pulses.     Heart sounds: Normal heart sounds. No murmur heard.    No friction rub. No gallop.  Pulmonary:     Effort: Pulmonary effort is normal. No respiratory distress.     Breath sounds: Normal breath sounds. No wheezing.  Skin:    General: Skin is warm and dry.  Neurological:     Mental Status: She is alert and oriented to person,  place, and time.  Psychiatric:        Mood and Affect: Mood normal.        Behavior: Behavior normal.        Thought Content: Thought content normal.        Judgment: Judgment normal.    Assessment & Plan   Problem List Items Addressed This Visit       Cardiovascular and Mediastinum   Essential hypertension   Hypertension managed with irbesartan  300mg  daily, effective and well tolerated. Denies concerning symptoms.  - BP at goal.  - Continue irbesartan  as prescribed.         Endocrine   Controlled type 2 diabetes mellitus with complication, without long-term current use of insulin (HCC)   Blood glucose levels well-controlled. Hemoglobin A1c at 5.6% today. Current Mounjaro  effective for glycemic control. - Increase Mounjaro  to 12.5 mg for optimal weight management. - Encourage increased protein intake. - Perform urine microalbumin test- abnormal.      Relevant Medications   tirzepatide  (MOUNJARO ) 12.5 MG/0.5ML Pen   Other Relevant Orders   POCT HgB A1C (Completed)   POCT UA - Microalbumin (Completed)   HM Diabetes Foot Exam (Completed)   Hyperlipidemia associated with type 2 diabetes mellitus (HCC) - Primary   History of elevated cholesterol but to date has been resistant to statin therapy.  - Rechecking lipids after dietary and lifestyle modifications. - Reviewed goals for cholesterol in diabetic  patients and the benefit of cardiovascular protection when on statin medications.       Relevant Medications   tirzepatide  (MOUNJARO ) 12.5 MG/0.5ML Pen   Other Relevant Orders   HM Diabetes Foot Exam (Completed)     Other   Constipation   Chronic constipation persists w/o response to Miralax . Mounjaro  may contribute. No prior trial of Linzess, Amitiza, Trulance, or Ibsrela.  - Unclear coverage via Medicare.   - Samples of Ibsrela provided for trial. - Increase fluids and fiber intake.  - Continue Miralax  if desired up to 17g twice daily in at least 4-8 ounces of water.        Morbid obesity (HCC)   Weight management challenging. Patient desires improved outcomes. - Increase Mounjaro  to 12.5 mg for weight loss. - Encourage increased protein intake through dietary modifications.      Relevant Medications   tirzepatide  (MOUNJARO ) 12.5 MG/0.5ML Pen   Other Visit Diagnoses       Colon cancer screening       Relevant Orders   Ambulatory referral to Gastroenterology     Need for influenza vaccination       Relevant Orders   Flu vaccine HIGH DOSE PF(Fluzone Trivalent) (Completed)       Follow up   Return in about 6 months (around 03/10/2025) for chronic disease follow up. __________________________________ Zada FREDRIK Palin, DNP, APRN, FNP-BC Primary Care and Sports Medicine [facility]

## 2024-09-10 NOTE — Assessment & Plan Note (Signed)
 Blood glucose levels well-controlled. Hemoglobin A1c at 5.6% today. Current Mounjaro  effective for glycemic control. - Increase Mounjaro  to 12.5 mg for optimal weight management. - Encourage increased protein intake. - Perform urine microalbumin test- abnormal.

## 2024-09-10 NOTE — Assessment & Plan Note (Signed)
 History of elevated cholesterol but to date has been resistant to statin therapy.  - Rechecking lipids after dietary and lifestyle modifications. - Reviewed goals for cholesterol in diabetic patients and the benefit of cardiovascular protection when on statin medications.

## 2024-09-10 NOTE — Assessment & Plan Note (Signed)
 Chronic constipation persists w/o response to Miralax . Mounjaro  may contribute. No prior trial of Linzess, Amitiza, Trulance, or Ibsrela.  - Unclear coverage via Medicare.   - Samples of Ibsrela provided for trial. - Increase fluids and fiber intake.  - Continue Miralax  if desired up to 17g twice daily in at least 4-8 ounces of water.

## 2024-09-15 ENCOUNTER — Other Ambulatory Visit (HOSPITAL_COMMUNITY): Payer: Self-pay

## 2024-09-15 ENCOUNTER — Telehealth: Payer: Self-pay

## 2024-09-15 NOTE — Telephone Encounter (Signed)
 Copied from CRM [id number]. Topic: Clinical - Medication Question >> Sep 15, 2024  1:54 PM Brittney F wrote: Reason for CRM:   Medication In question: tirzepatide  (MOUNJARO ) 12.5 MG/0.5ML Pen  Patent was recently prescribed the 12.5 dose of the Mounjaro  prescription and is awaiting the prior authorization to go through for the increased dose. While she waits patient has now been out of the prescription for  3 weeks. Patient is calling in to ask if samples of the Mounjaro  are available for pick up at the office.   Please call the patient to inform her of such and when they will be ready for pick up.  Callback Number: [phone]

## 2024-09-15 NOTE — Telephone Encounter (Signed)
 Pharmacy Patient Advocate Encounter   Received notification from Pt Calls Messages that prior authorization for Mounjaro  12.5mg /0.28ml is required/requested.   Insurance verification completed.   The patient is insured through [facility] .   Per test claim: PA required; PA submitted to above mentioned insurance via Latent Key/confirmation #/EOC [id number] Status is pending

## 2024-09-15 NOTE — Telephone Encounter (Signed)
 Per patient - Mounjaro  12.5mg  needing a prior authorization  Thank you

## 2024-09-16 ENCOUNTER — Other Ambulatory Visit (HOSPITAL_COMMUNITY): Payer: Self-pay

## 2024-09-16 NOTE — Telephone Encounter (Signed)
 Pharmacy Patient Advocate Encounter  Received notification from [facility] that Prior Authorization for Mounjaro  12.5MG /0.5ML auto-injectors has been APPROVED from 09/15/2024 to 09/15/2025. Ran test claim, Copay is $0.00. This test claim was processed through [facility]- copay amounts may vary at other pharmacies due to pharmacy/plan contracts, or as the patient moves through the different stages of their insurance plan.   PA #/Case ID/Reference #: [id number]

## 2024-09-16 NOTE — Telephone Encounter (Signed)
 09/16/24 10:17 AM Note Pharmacy Patient Advocate Encounter   Received notification from [facility] that Prior Authorization for Mounjaro  12.5MG /0.5ML auto-injectors has been APPROVED from 09/15/2024 to 09/15/2025. Ran test claim, Copay is $0.00

## 2024-09-16 NOTE — Telephone Encounter (Signed)
 Pt notified through MyChart. Roselyn Reef, CMA

## 2024-09-16 NOTE — Telephone Encounter (Signed)
 Natalie Carney, [facility]    09/16/24  1:38 PM Note Pt notified through MyChart. Carney Natalie, CMA

## 2024-09-20 ENCOUNTER — Ambulatory Visit
Admission: EM | Admit: 2024-09-20 | Discharge: 2024-09-20 | Disposition: A | Discharge status: Other Acute Inpatient Hospital | Attending: Family Medicine | Admitting: Family Medicine

## 2024-09-20 ENCOUNTER — Encounter: Payer: Self-pay | Admitting: Emergency Medicine

## 2024-09-20 ENCOUNTER — Ambulatory Visit (INDEPENDENT_AMBULATORY_CARE_PROVIDER_SITE_OTHER)

## 2024-09-20 DIAGNOSIS — R0602 Shortness of breath: Secondary | ICD-10-CM

## 2024-09-20 DIAGNOSIS — Z86711 Personal history of pulmonary embolism: Secondary | ICD-10-CM | POA: Diagnosis not present

## 2024-09-20 NOTE — Discharge Instructions (Signed)
 You need additional evaluation that I cannot provide I recommend you go directly to the emergency room

## 2024-09-20 NOTE — ED Provider Notes (Signed)
 [facility]    CSN: [id number] Arrival date & time: 09/20/24  1352      History   Chief Complaint Chief Complaint  Patient presents with   Shortness of Breath    HPI Natalie Carney is a 68 y.o. female.   HPI  Patient is here for acute shortness of breath.  Started yesterday.  She states that she was unable to sleep last night because she could not lie down.  She states that she feels unable to take a deep breath.  No chest pain.  No cough.  No fever or chills.  No headache or bodyaches.  She has not had any swelling of her legs or pain to indicate DVT.  She is on chronic anticoagulation.  She states that she has been on a number of anticoagulant medications because she keeps failing them.  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  She states this feels slightly different than her last pulmonary embolus because she is not having any chest pain. No underlying asthma or lung disease Remote history of smoking  Past Medical History:  Diagnosis Date   Abnormal mammogram    Arthritis    both knees   Colon polyp    Complication of anesthesia    DVT (deep venous thrombosis) (HCC)    several dvt's last dvt was 3 to 4 yrs ago per pt on 06-14-2022   GERD (gastroesophageal reflux disease)    Hypertension    Lymphedema    both legs uses compression machine at home and wears compression hose   PONV (postoperative nausea and vomiting)    post op ponv after myometcomy yrs ago   Posterior vaginal wall prolapse    Pulmonary embolism (HCC)    yrs ago   Type 2 Diabetes mellitus without complication [facility])     Patient Active Problem List   Diagnosis Date Noted   Constipation 09/10/2024   Chronic cough 06/05/2024   Hyperlipidemia associated with type 2 diabetes mellitus (HCC) 03/11/2024   Deep vein thrombosis (DVT) of proximal lower extremity (HCC) 03/11/2024   Pulmonary embolism (HCC) 03/11/2024   Morbid obesity (HCC) 09/12/2023    Hematuria 04/03/2022   Urge incontinence 04/03/2022   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   Chronic anticoagulation 04/11/2021   Essential hypertension 04/11/2021   Controlled type 2 diabetes mellitus with complication, without long-term current use of insulin (HCC) 04/11/2021   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   Venous (peripheral) insufficiency 03/14/2021    Past Surgical History:  Procedure Laterality Date   ABDOMINAL HYSTERECTOMY  2005   KNEE SURGERY Right    arthroscopic right knee   LAPAROSCOPIC GASTRIC BANDING     MYOMECTOMY  2003   RECTOCELE REPAIR N/A 06/18/2022   Procedure: POSTERIOR REPAIR (RECTOCELE), ILLIOCOCCYGEAL SUSPENSION;  Surgeon: Marilynne Rosaline SAILOR, MD;  Location: [facility] [place];  Service: Gynecology;  Laterality: N/A;   SHOULDER SURGERY Left    x 2 yrs ago    OB History     Gravida  1   Para  1   Term      Preterm      AB      Living  1      SAB      IAB      Ectopic      Multiple      Live Births  1            Home Medications  Prior to Admission medications   Medication Sig Start Date End Date Taking? Authorizing Provider  acetaminophen  (TYLENOL ) 500 MG tablet Take 1 tablet (500 mg total) by mouth every 6 (six) hours as needed (pain). 05/29/22  Yes Marilynne Rosaline SAILOR, MD  Blood Glucose Monitoring Suppl DEVI 1 each by Does not apply route 3 (three) times daily as needed. May substitute to any manufacturer covered by patient's insurance. 03/12/23  Yes Willo Mini, NP  Cholecalciferol (D3 2000) 50 MCG (2000 UT) CAPS Take by mouth.   Yes [provider]  dabigatran  (PRADAXA ) 150 MG CAPS capsule Take 1 capsule (150 mg total) by mouth 2 (two) times daily. 07/29/24  Yes Covington, Sarah M, PA-C  Glucose Blood (BLOOD GLUCOSE TEST STRIPS) STRP 1 each by In Vitro route 4 (four) times daily as needed. May substitute to any manufacturer covered by patient's insurance. 03/12/23  Yes Willo Mini, NP  irbesartan   (AVAPRO ) 300 MG tablet TAKE 1 TABLET(300 MG) BY MOUTH DAILY 03/11/24  Yes Willo Mini, NP  magnesium gluconate (MAGONATE) 500 MG tablet Take 500 mg by mouth daily.   Yes [provider]  mirabegron  ER (MYRBETRIQ ) 50 MG TB24 tablet Take 1 tablet (50 mg total) by mouth daily. 03/19/24  Yes Marilynne Rosaline SAILOR, MD  mometasone (ELOCON) 0.1 % ointment Apply topically. 03/11/23  Yes [provider]  Multiple Vitamin (MULTIVITAMIN) tablet Take 1 tablet by mouth daily.   Yes [provider]  multivitamin-lutein (OCUVITE-LUTEIN) CAPS capsule Take 1 capsule by mouth daily.   Yes [provider]  omeprazole (PRILOSEC) 40 MG capsule Take 40 mg by mouth at bedtime. 08/29/24  Yes [provider]  tirzepatide  (MOUNJARO ) 12.5 MG/0.5ML Pen Inject 12.5 mg into the skin once a week. 09/10/24  Yes Willo Mini, NP    Family History Family History  Problem Relation Age of Onset   Pneumonia Mother    Diabetes Father    Hypertension Father    Heart attack Father    Cervical cancer Maternal Grandmother    Breast cancer Maternal Aunt    Breast cancer Other        cousin   Ovarian cancer Other        m. grandmother    Social History Social History   Tobacco Use   Smoking status: Former    Current packs/day: 0.00    Types: Cigarettes    Quit date: 12/25/1987    Years since quitting: 36.7   Smokeless tobacco: Never   Tobacco comments:    Social smoker quit 1989  Vaping Use   Vaping status: Never Used  Substance Use Topics   Alcohol use: Never   Drug use: Never     Allergies   Nsaids   Review of Systems Review of Systems See HPI  Physical Exam Triage Vital Signs ED Triage Vitals  Encounter Vitals Group     BP 09/20/24 1428 135/81     Girls Systolic BP Percentile --      Girls Diastolic BP Percentile --      Boys Systolic BP Percentile --      Boys Diastolic BP Percentile --      Pulse Rate 09/20/24 1428 92     Resp 09/20/24 1428 20     Temp  09/20/24 1428 98.9 F (37.2 C)     Temp Source 09/20/24 1428 Oral     SpO2 09/20/24 1428 97 %     Weight --      Height --  Head Circumference --      Peak Flow --      Pain Score 09/20/24 1427 0     Pain Loc --      Pain Education --      Exclude from Growth Chart --    No data found.  Updated Vital Signs BP 135/81 (BP Location: Right Arm)   Pulse 92   Temp 98.9 F (37.2 C) (Oral)   Resp 20   SpO2 97%      Physical Exam Constitutional:      General: She is in acute distress.     Appearance: She is well-developed. She is obese. She is ill-appearing.     Comments: Morbidly obese.  Mildly dyspneic.  Patient anxious due to feeling of shortness of breath  HENT:     Head: Normocephalic and atraumatic.  Eyes:     Conjunctiva/sclera: Conjunctivae normal.     Pupils: Pupils are equal, round, and reactive to light.  Cardiovascular:     Rate and Rhythm: Normal rate.  Pulmonary:     Effort: Pulmonary effort is normal. No respiratory distress.     Breath sounds: Decreased breath sounds present.     Comments: Patient is taking shallow breaths.  Decreased breath sounds throughout, symmetric Abdominal:     General: There is no distension.  Musculoskeletal:        General: Normal range of motion.     Cervical back: Normal range of motion.     Right lower leg: No edema.     Left lower leg: No edema.     Comments: No tenderness to deep palpation, or edema to associate with DVT  Skin:    General: Skin is warm and dry.  Neurological:     Mental Status: She is alert.      UC Treatments / Results  Labs (all labs ordered are listed, but only abnormal results are displayed) Labs Reviewed - No data to display  EKG   Radiology DG Chest 2 View Result Date: 09/20/2024 CLINICAL DATA:  Short of breath, history of DVT EXAM: CHEST - 2 VIEW COMPARISON:  06/03/2024 FINDINGS: Frontal and lateral views of the chest demonstrate a stable cardiac silhouette. No airspace disease,  effusion, or pneumothorax. Stable hiatal hernia. No acute bony abnormalities. IMPRESSION: 1. Stable chest, no acute process. Electronically Signed   By: Ozell Daring M.D.   On: 09/20/2024 15:18    Procedures Procedures (including critical care time)  Medications Ordered in UC Medications - No data to display  Initial Impression / Assessment and Plan / UC Course  I have reviewed the triage vital signs and the nursing notes.  Pertinent labs & imaging results that were available during my care of the patient were reviewed by me and considered in my medical decision making (see chart for details).     Patient is uncomfortable with her shortness of breath.  I am unable to provide for her any reason or treatment for her shortness of breath.  I believe she needs to go to the emergency room for rule out pulmonary embolus.  Patient agrees Final Clinical Impressions(s) / UC Diagnoses   Final diagnoses:  SOB (shortness of breath)  History of pulmonary embolus (PE)     Discharge Instructions      You need additional evaluation that I cannot provide I recommend you go directly to the emergency room     ED Prescriptions   None    PDMP not reviewed this encounter.  Maranda Jamee Jacob, MD 09/20/24 [id number]

## 2024-09-20 NOTE — ED Triage Notes (Signed)
 Patient presents to Urgent Care with complaints of shortness of breath since 1 day ago. Patient reports having shortness of breath with regular breathing. No history of asthma. Does have blood clots but is currently on medication. No other upper respiratory symptoms. Denies any swelling in feet or ankles.

## 2024-10-01 ENCOUNTER — Telehealth: Payer: Self-pay | Admitting: Medical-Surgical

## 2024-10-01 NOTE — Telephone Encounter (Unsigned)
 Copied from CRM [id number]. Topic: Clinical - Medication Question >> Oct 01, 2024 10:27 AM Tonda B wrote: Reason for CRM: patient had a sample of this rx ibsrela 50 mg says it works for her and would like a rx of the medication please call pt back [phone] (M)

## 2024-10-02 ENCOUNTER — Other Ambulatory Visit: Payer: Self-pay | Admitting: Medical-Surgical

## 2024-10-02 MED ORDER — IBSRELA 50 MG PO TABS: 50.0000 mg | ORAL_TABLET | Freq: Every day | ORAL | 5 refills | Status: DC

## 2024-10-02 NOTE — Telephone Encounter (Signed)
 Prescription for Ibsrela sent to the pharmacy.  This is showing that it is not a covered medication however this is not always accurate in our system. ___________________________________________ Zada FREDRIK Palin, DNP, APRN, FNP-BC Primary Care and Sports Medicine [facility] [place]

## 2024-10-06 MED ORDER — LINACLOTIDE 72 MCG PO CAPS: 72.0000 ug | ORAL_CAPSULE | Freq: Every day | ORAL | 11 refills | Status: DC

## 2024-10-14 ENCOUNTER — Telehealth: Payer: Self-pay

## 2024-10-14 NOTE — Telephone Encounter (Signed)
 I tried to call patient for colon cancer screening. No answer.

## 2024-10-16 MED ORDER — LINACLOTIDE 145 MCG PO CAPS: 145.0000 ug | ORAL_CAPSULE | Freq: Every day | ORAL | 11 refills | Status: AC

## 2024-10-16 NOTE — Addendum Note (Signed)
 Addended byBETHA WILLO MINI on: 10/16/2024 01:16 PM   Modules accepted: Orders

## 2024-10-23 ENCOUNTER — Telehealth: Payer: Self-pay | Admitting: *Deleted

## 2024-10-23 NOTE — Telephone Encounter (Signed)
 Message received from patient stating that she has an extremely painful vein in right leg and would like to know what to do.  Call placed back to patient and patient instructed to go to an urgent care or local ER now.  Pt states that she will go to the ER in [place] now to be assessed and is appreciative of assistance.

## 2024-11-17 ENCOUNTER — Inpatient Hospital Stay: Payer: Self-pay | Admitting: Medical Oncology

## 2024-11-17 ENCOUNTER — Inpatient Hospital Stay (HOSPITAL_BASED_OUTPATIENT_CLINIC_OR_DEPARTMENT_OTHER): Admitting: Medical Oncology

## 2024-11-17 ENCOUNTER — Inpatient Hospital Stay: Attending: Family

## 2024-11-17 ENCOUNTER — Encounter: Payer: Self-pay | Admitting: Medical Oncology

## 2024-11-17 ENCOUNTER — Inpatient Hospital Stay

## 2024-11-17 VITALS — BP 126/49 | HR 84 | Temp 98.9°F | Resp 18 | Wt 268.0 lb

## 2024-11-17 DIAGNOSIS — D649 Anemia, unspecified: Secondary | ICD-10-CM

## 2024-11-17 DIAGNOSIS — I2699 Other pulmonary embolism without acute cor pulmonale: Secondary | ICD-10-CM

## 2024-11-17 DIAGNOSIS — L03115 Cellulitis of right lower limb: Secondary | ICD-10-CM | POA: Insufficient documentation

## 2024-11-17 DIAGNOSIS — Z7901 Long term (current) use of anticoagulants: Secondary | ICD-10-CM

## 2024-11-17 DIAGNOSIS — I872 Venous insufficiency (chronic) (peripheral): Secondary | ICD-10-CM | POA: Insufficient documentation

## 2024-11-17 DIAGNOSIS — I824Y9 Acute embolism and thrombosis of unspecified deep veins of unspecified proximal lower extremity: Secondary | ICD-10-CM

## 2024-11-17 LAB — CMP (CANCER CENTER ONLY)
ALT: 19 U/L (ref 0–44)
AST: 18 U/L (ref 15–41)
Albumin: 3.7 g/dL (ref 3.5–5.0)
Alkaline Phosphatase: 88 U/L (ref 38–126)
Anion gap: 10 (ref 5–15)
BUN: 8 mg/dL (ref 8–23)
CO2: 27 mmol/L (ref 22–32)
Calcium: 8.8 mg/dL — ABNORMAL LOW (ref 8.9–10.3)
Chloride: 105 mmol/L (ref 98–111)
Creatinine: 0.8 mg/dL (ref 0.44–1.00)
GFR, Estimated: >60 mL/min (ref >60)
Glucose, Bld: 111 mg/dL — ABNORMAL HIGH (ref 70–99)
Potassium: 3.8 mmol/L (ref 3.5–5.1)
Sodium: 142 mmol/L (ref 135–145)
Total Bilirubin: 0.5 mg/dL (ref 0.0–1.2)
Total Protein: 7 g/dL (ref 6.5–8.1)

## 2024-11-17 LAB — CBC
HCT: 35.7 % — ABNORMAL LOW (ref 36.0–46.0)
Hemoglobin: 11.4 g/dL — ABNORMAL LOW (ref 12.0–15.0)
MCH: 25.3 pg — ABNORMAL LOW (ref 26.0–34.0)
MCHC: 31.9 g/dL (ref 30.0–36.0)
MCV: 79.3 fL — ABNORMAL LOW (ref 80.0–100.0)
Platelets: 261 K/uL (ref 150–400)
RBC: 4.5 MIL/uL (ref 3.87–5.11)
RDW: 14.6 % (ref 11.5–15.5)
WBC: 7.7 K/uL (ref 4.0–10.5)
nRBC: 0 % (ref 0.0–0.2)

## 2024-11-17 MED ORDER — DOXYCYCLINE HYCLATE 100 MG PO TABS: 100.0000 mg | ORAL_TABLET | Freq: Two times a day (BID) | ORAL | 0 refills | Status: AC

## 2024-11-17 MED ORDER — ENOXAPARIN SODIUM 120 MG/0.8ML IJ SOSY: 120.0000 mg | PREFILLED_SYRINGE | Freq: Two times a day (BID) | INTRAMUSCULAR | 6 refills | Status: DC

## 2024-11-17 NOTE — Progress Notes (Signed)
 Hematology and Oncology Follow Up Visit  Natalie Carney [id number] [birth year] 68 y.o. 11/17/2024   Principle Diagnosis:  History of multiple thrombotic events secondary to venous insufficiency    Past Therapy: Failed Coumadin, Xarelto, Eliquis  - recurrence    Current Therapy:        Pradaxa  PO BID    Interim History:  Natalie Carney is here today for follow-up.  Pt reports that she has had right leg pain and swelling for about 2 months. She has gone to the ER twice for this but was told that no new DVT was found. She is still on her Pradaxa  which she is tolerating well.  She denies fevers, chest pain, SOB There has been no bleeding to her knowledge: denies epistaxis, gingivitis, hemoptysis, hematemesis, hematuria, melena, excessive bruising, blood donation.  Of note she has a colonoscopy scheduled for next Thursday.  No fever, chills, n/v, cough, rash, dizziness, SOB, chest pain, palpitations, abdominal pain or changes in bowel or bladder habits.  She eats prunes and drinks prune juice to help with chronic constipation.  She has not noted any blood loss. No abnormal bruising, no petechiae.  No falls or syncope reported.  Appetite and hydration are good.  Wt Readings from Last 3 Encounters:  11/17/24 268 lb 0.6 oz (121.6 kg)  09/10/24 275 lb (124.7 kg)  07/29/24 279 lb 6.4 oz (126.7 kg)     ECOG Performance Status: 1 - Symptomatic but completely ambulatory  Medications:  Allergies as of 11/17/2024       Reactions   Nsaids Other (See Comments)   On Blood Thinners        Medication List        Accurate as of November 17, 2024  1:03 PM. If you have any questions, ask your nurse or doctor.          STOP taking these medications    dabigatran  150 MG Caps capsule Commonly known as: PRADAXA  Stopped by: Lauraine CHRISTELLA Dais       TAKE these medications    acetaminophen  500 MG tablet Commonly known as: TYLENOL  Take 1 tablet (500 mg total) by mouth  every 6 (six) hours as needed (pain).   Blood Glucose Monitoring Suppl Devi 1 each by Does not apply route 3 (three) times daily as needed. May substitute to any manufacturer covered by patient's insurance.   BLOOD GLUCOSE TEST STRIPS Strp 1 each by In Vitro route 4 (four) times daily as needed. May substitute to any manufacturer covered by patient's insurance.   D3 2000 50 MCG (2000 UT) Caps Generic drug: Cholecalciferol Take by mouth.   doxycycline  100 MG tablet Commonly known as: VIBRA -TABS Take 1 tablet (100 mg total) by mouth 2 (two) times daily for 10 days. Started by: Lauraine CHRISTELLA Dais   enoxaparin  120 MG/0.8ML injection Commonly known as: LOVENOX  Inject 0.8 mLs (120 mg total) into the skin every 12 (twelve) hours. Started by: Lauraine CHRISTELLA Dais   irbesartan  300 MG tablet Commonly known as: AVAPRO  TAKE 1 TABLET(300 MG) BY MOUTH DAILY   linaclotide  145 MCG Caps capsule Commonly known as: Linzess  Take 1 capsule (145 mcg total) by mouth daily before breakfast.   magnesium gluconate 500 MG tablet Commonly known as: MAGONATE Take 500 mg by mouth daily.   mirabegron  ER 50 MG Tb24 tablet Commonly known as: Myrbetriq  Take 1 tablet (50 mg total) by mouth daily.   mometasone 0.1 % ointment Commonly known as: ELOCON Apply topically.   multivitamin  tablet Take 1 tablet by mouth daily.   multivitamin-lutein Caps capsule Take 1 capsule by mouth daily.   omeprazole 40 MG capsule Commonly known as: PRILOSEC Take 40 mg by mouth at bedtime.   tirzepatide  12.5 MG/0.5ML Pen Commonly known as: MOUNJARO  Inject 12.5 mg into the skin once a week.        Allergies:  Allergies  Allergen Reactions   Nsaids Other (See Comments)    On Blood Thinners    Past Medical History, Surgical history, Social history, and Family History were reviewed and updated.  Review of Systems: All other 10 point review of systems is negative.   Physical Exam:  weight is 268 lb 0.6 oz  (121.6 kg). Her oral temperature is 98.9 F (37.2 C). Her blood pressure is 126/49 (abnormal) and her pulse is 84. Her respiration is 18 and oxygen saturation is 100%.   Wt Readings from Last 3 Encounters:  11/17/24 268 lb 0.6 oz (121.6 kg)  09/10/24 275 lb (124.7 kg)  07/29/24 279 lb 6.4 oz (126.7 kg)    Ocular: Sclerae unicteric, pupils equal, round and reactive to light Ear-nose-throat: Oropharynx clear, dentition fair Lymphatic: No cervical or supraclavicular adenopathy Lungs no rales or rhonchi, good excursion bilaterally Heart regular rate and rhythm, no murmur appreciated Abd soft Neuro: non-focal, well-oriented, appropriate affect MSK: The skin of the right lower leg is hyperpigmented with a 4 inch round section of Peau d'orange texture and increased warmth. Mild edema and erythema. No discharge or skin breakdown. Negative Homans sign. The left leg is without edema, erythema, increased warmth or skin texture changes.   Lab Results  Component Value Date   WBC 7.7 11/17/2024   HGB 11.4 (L) 11/17/2024   HCT 35.7 (L) 11/17/2024   MCV 79.3 (L) 11/17/2024   PLT 261 11/17/2024   Lab Results  Component Value Date   FERRITIN 21 09/14/2022   IRON 57 09/14/2022   TIBC 355 09/14/2022   IRONPCTSAT 16 09/14/2022   Lab Results  Component Value Date   RBC 4.50 11/17/2024   No results found for: KPAFRELGTCHN, LAMBDASER, KAPLAMBRATIO No results found for: IGGSERUM, IGA, IGMSERUM No results found for: STEPHANY CARLOTA BENSON MARKEL EARLA JOANNIE DOC VICK, SPEI   Chemistry      Component Value Date/Time   NA 142 11/17/2024 1106   K 3.8 11/17/2024 1106   CL 105 11/17/2024 1106   CO2 27 11/17/2024 1106   BUN 8 11/17/2024 1106   CREATININE 0.80 11/17/2024 1106   CREATININE 0.79 09/14/2022 0000      Component Value Date/Time   CALCIUM 8.8 (L) 11/17/2024 1106   ALKPHOS 88 11/17/2024 1106   AST 18 11/17/2024 1106   ALT 19 11/17/2024 1106    BILITOT 0.5 11/17/2024 1106     Encounter Diagnoses  Name Primary?   Anemia, unspecified type    Cellulitis of right lower extremity Yes    Impression and Plan:  Natalie Carney is a very pleasant 68 yo African American female with history of multiple thrombotic events and venous insufficiency. 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 She also has a history of IDA. She has been treated with IV iron previously when she lived in [place]. She was switched from Eliquis  to Pradaxa  due to history of Eliquis  failure.   Hgb is 11.4 today- essentially stable from 3 months ago value of 11.7 however she has new microcytosis.  We will screening for iron deficiency at her follow up next week Today we  are going to treat her cellulitis with doxycyline. I have reviewed the doppler scans from recent ER visits. Given symptoms I have suggested that she stop her Pradaxa  and start Lovenox  to cover for any potential DVT.  She will return on Monday to ensure that she is improving. If so she can go forward with her scheduled colonoscopy on Thursday.   RTC Monday APP, labs  Lauraine CHRISTELLA Dais, [place] 11/25/20251:03 PM

## 2024-11-18 ENCOUNTER — Ambulatory Visit: Payer: Self-pay | Admitting: Medical Oncology

## 2024-11-23 ENCOUNTER — Inpatient Hospital Stay: Attending: Medical Oncology | Admitting: Medical Oncology

## 2024-11-23 ENCOUNTER — Inpatient Hospital Stay

## 2024-11-23 ENCOUNTER — Encounter: Payer: Self-pay | Admitting: Medical Oncology

## 2024-11-23 ENCOUNTER — Other Ambulatory Visit: Payer: Self-pay

## 2024-11-23 DIAGNOSIS — Z862 Personal history of diseases of the blood and blood-forming organs and certain disorders involving the immune mechanism: Secondary | ICD-10-CM | POA: Diagnosis not present

## 2024-11-23 DIAGNOSIS — D649 Anemia, unspecified: Secondary | ICD-10-CM

## 2024-11-23 DIAGNOSIS — Z7901 Long term (current) use of anticoagulants: Secondary | ICD-10-CM | POA: Insufficient documentation

## 2024-11-23 DIAGNOSIS — L03115 Cellulitis of right lower limb: Secondary | ICD-10-CM | POA: Insufficient documentation

## 2024-11-23 DIAGNOSIS — I872 Venous insufficiency (chronic) (peripheral): Secondary | ICD-10-CM | POA: Diagnosis present

## 2024-11-23 LAB — FERRITIN: Ferritin: 13 ng/mL (ref 11–307)

## 2024-11-23 LAB — CBC
HCT: 35.1 % — ABNORMAL LOW (ref 36.0–46.0)
Hemoglobin: 11 g/dL — ABNORMAL LOW (ref 12.0–15.0)
MCH: 25 pg — ABNORMAL LOW (ref 26.0–34.0)
MCHC: 31.3 g/dL (ref 30.0–36.0)
MCV: 79.8 fL — ABNORMAL LOW (ref 80.0–100.0)
Platelets: 261 K/uL (ref 150–400)
RBC: 4.4 MIL/uL (ref 3.87–5.11)
RDW: 14.7 % (ref 11.5–15.5)
WBC: 6.7 K/uL (ref 4.0–10.5)
nRBC: 0 % (ref 0.0–0.2)

## 2024-11-23 LAB — IRON AND IRON BINDING CAPACITY (CC-WL,HP ONLY)
Iron: 48 ug/dL (ref 28–170)
Saturation Ratios: 12 % (ref 10.4–31.8)
TIBC: 400 ug/dL (ref 250–450)
UIBC: 352 ug/dL

## 2024-11-23 LAB — VITAMIN B12: Vitamin B-12: 1360 pg/mL — ABNORMAL HIGH (ref 180–914)

## 2024-11-23 NOTE — Progress Notes (Signed)
 Hematology and Oncology Follow Up Visit  Natalie Carney [id number] May 03, 1956 68 y.o. 11/23/2024   Principle Diagnosis:  History of multiple thrombotic events secondary to venous insufficiency    Past Therapy: Failed Coumadin, Xarelto, Eliquis  - recurrence    Current Therapy:        Pradaxa  PO BID    Interim History:  Natalie Carney is here today for follow-up.  At her last visit to our office on 11/17/2024 she was started on doxycyline for suspected cellulitis and switched her from Pradaxa  to Lovenox  due to edema concerns.   Today she states that the swelling, redness and pain have all improved. Previously rated 12/10 now 8/10. She is able to walk better. She is tolerating the medications well. No fevers.  She denies fevers, chest pain, SOB There has been no bleeding to her knowledge: denies epistaxis, gingivitis, hemoptysis, hematemesis, hematuria, melena, excessive bruising, blood donation.  Of note she has a colonoscopy scheduled for Thursday.  No fever, chills, n/v, cough, rash, dizziness, SOB, chest pain, palpitations, abdominal pain or changes in bowel or bladder habits.  She eats prunes and drinks prune juice to help with chronic constipation.  She has not noted any blood loss. No abnormal bruising, no petechiae.  No falls or syncope reported.  Appetite and hydration are good.  Wt Readings from Last 3 Encounters:  11/23/24 (P) 270 lb (122.5 kg)  11/17/24 268 lb 0.6 oz (121.6 kg)  09/10/24 275 lb (124.7 kg)     ECOG Performance Status: 1 - Symptomatic but completely ambulatory  Medications:  Allergies as of 11/23/2024       Reactions   Nsaids Other (See Comments)   On Blood Thinners        Medication List        Accurate as of November 23, 2024  2:48 PM. If you have any questions, ask your nurse or doctor.          acetaminophen  500 MG tablet Commonly known as: TYLENOL  Take 1 tablet (500 mg total) by mouth every 6 (six) hours as needed  (pain).   Blood Glucose Monitoring Suppl Devi 1 each by Does not apply route 3 (three) times daily as needed. May substitute to any manufacturer covered by patient's insurance.   BLOOD GLUCOSE TEST STRIPS Strp 1 each by In Vitro route 4 (four) times daily as needed. May substitute to any manufacturer covered by patient's insurance.   cyanocobalamin 100 MCG tablet Take 100 mcg by mouth daily.   D3 2000 50 MCG (2000 UT) Caps Generic drug: Cholecalciferol Take by mouth.   doxycycline  100 MG tablet Commonly known as: VIBRA -TABS Take 1 tablet (100 mg total) by mouth 2 (two) times daily for 10 days.   enoxaparin  120 MG/0.8ML injection Commonly known as: LOVENOX  Inject 0.8 mLs (120 mg total) into the skin every 12 (twelve) hours.   irbesartan  300 MG tablet Commonly known as: AVAPRO  TAKE 1 TABLET(300 MG) BY MOUTH DAILY   linaclotide  145 MCG Caps capsule Commonly known as: Linzess  Take 1 capsule (145 mcg total) by mouth daily before breakfast.   Magnesium 100 MG Caps Take 100 mg by mouth as needed.   magnesium gluconate 500 MG tablet Commonly known as: MAGONATE Take 500 mg by mouth daily.   mirabegron  ER 50 MG Tb24 tablet Commonly known as: Myrbetriq  Take 1 tablet (50 mg total) by mouth daily.   mometasone 0.1 % ointment Commonly known as: ELOCON Apply topically.   multivitamin tablet Take 1  tablet by mouth daily.   multivitamin-lutein Caps capsule Take 1 capsule by mouth daily.   omeprazole 40 MG capsule Commonly known as: PRILOSEC Take 40 mg by mouth at bedtime.   tirzepatide  12.5 MG/0.5ML Pen Commonly known as: MOUNJARO  Inject 12.5 mg into the skin once a week.        Allergies:  Allergies  Allergen Reactions   Nsaids Other (See Comments)    On Blood Thinners    Past Medical History, Surgical history, Social history, and Family History were reviewed and updated.  Review of Systems: All other 10 point review of systems is negative.   Physical  Exam:  height is 5' 7 (1.702 m) (pended) and weight is 270 lb (122.5 kg) (pended). Her oral temperature is 98.8 F (37.1 C) (pended). Her blood pressure is 122/64 (pended) and her pulse is 81 (pended). Her respiration is 16 (pended) and oxygen saturation is 100% (pended).   Wt Readings from Last 3 Encounters:  11/23/24 (P) 270 lb (122.5 kg)  11/17/24 268 lb 0.6 oz (121.6 kg)  09/10/24 275 lb (124.7 kg)    Ocular: Sclerae unicteric, pupils equal, round and reactive to light Ear-nose-throat: Oropharynx clear, dentition fair Lymphatic: No cervical or supraclavicular adenopathy Lungs no rales or rhonchi, good excursion bilaterally Heart regular rate and rhythm, no murmur appreciated Abd soft Neuro: non-focal, well-oriented, appropriate affect MSK: The skin of the right lower leg has reduced hyperpigmentation. Mild edema and erythema. No discharge or skin breakdown. Negative Homans sign. The left leg is without edema, erythema, increased warmth or skin texture changes.   Lab Results  Component Value Date   WBC 6.7 11/23/2024   HGB 11.0 (L) 11/23/2024   HCT 35.1 (L) 11/23/2024   MCV 79.8 (L) 11/23/2024   PLT 261 11/23/2024   Lab Results  Component Value Date   FERRITIN 21 09/14/2022   IRON 57 09/14/2022   TIBC 355 09/14/2022   IRONPCTSAT 16 09/14/2022   Lab Results  Component Value Date   RBC 4.40 11/23/2024   No results found for: KPAFRELGTCHN, LAMBDASER, KAPLAMBRATIO No results found for: IGGSERUM, IGA, IGMSERUM No results found for: STEPHANY CARLOTA BENSON MARKEL EARLA JOANNIE DOC VICK, SPEI   Chemistry      Component Value Date/Time   NA 142 11/17/2024 1106   K 3.8 11/17/2024 1106   CL 105 11/17/2024 1106   CO2 27 11/17/2024 1106   BUN 8 11/17/2024 1106   CREATININE 0.80 11/17/2024 1106   CREATININE 0.79 09/14/2022 0000      Component Value Date/Time   CALCIUM 8.8 (L) 11/17/2024 1106   ALKPHOS 88 11/17/2024 1106   AST 18  11/17/2024 1106   ALT 19 11/17/2024 1106   BILITOT 0.5 11/17/2024 1106     Encounter Diagnoses  Name Primary?   Cellulitis of right lower extremity Yes   Chronic anticoagulation    Impression and Plan:  [name] is a very pleasant 68 yo African American female with history of multiple thrombotic events and venous insufficiency. 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 She also has a history of IDA. She has been treated with IV iron previously when she lived in [place].   Hgb is 11.0.   Iron and B12 studies pending.  Continue Doxycycline  for her cellulitis.  She will continue the Lovenox  instead of the Pradaxa  as this is the only anticoagulant that she has not failed.  She elects to push colonoscopy out by at least 1 week to finish ABX course.  In terms of her Lovenox  and upcoming colonoscopy, she will hold the morning dose of her Lovenox  the morning of her procedure.   RTC 6 months APP, labs Lauraine CHRISTELLA Dais, [place] 12/1/20252:48 PM

## 2024-11-25 ENCOUNTER — Ambulatory Visit: Payer: Self-pay | Admitting: Medical Oncology

## 2024-12-07 ENCOUNTER — Other Ambulatory Visit: Payer: Self-pay | Admitting: Medical-Surgical

## 2024-12-08 ENCOUNTER — Other Ambulatory Visit: Payer: Self-pay

## 2024-12-08 ENCOUNTER — Telehealth: Payer: Self-pay

## 2024-12-08 ENCOUNTER — Other Ambulatory Visit (HOSPITAL_BASED_OUTPATIENT_CLINIC_OR_DEPARTMENT_OTHER): Payer: Self-pay

## 2024-12-08 DIAGNOSIS — Z7901 Long term (current) use of anticoagulants: Secondary | ICD-10-CM

## 2024-12-08 DIAGNOSIS — I824Y9 Acute embolism and thrombosis of unspecified deep veins of unspecified proximal lower extremity: Secondary | ICD-10-CM

## 2024-12-08 DIAGNOSIS — I2699 Other pulmonary embolism without acute cor pulmonale: Secondary | ICD-10-CM

## 2024-12-08 MED ORDER — ENOXAPARIN SODIUM 120 MG/0.8ML IJ SOSY
120.0000 mg | PREFILLED_SYRINGE | Freq: Two times a day (BID) | INTRAMUSCULAR | 0 refills | Status: DC
  Filled 2024-12-08: qty 8, 5d supply, fill #0

## 2024-12-08 NOTE — Telephone Encounter (Signed)
 Copied from CRM #[id number]. Topic: General - Other >> Dec 08, 2024  2:31 PM Dedra B wrote: Reason for CRM: Sheree from [employer] called to se if form regarding statin therapy had been received. She will resend form. She needs the form signed and faxed back.

## 2024-12-08 NOTE — Progress Notes (Signed)
 Pt called in stating her pharmacy is out of Lovenox . Pt is wanting to know if we can give her samples to get her through a few days. Advised pt we do not keep samples on hand but I could check with the pharmacy down stairs [facility]) and pt agreed. Pharmacy has Lovenox - ERX sent for 5 days. Pt aware.

## 2024-12-08 NOTE — Telephone Encounter (Signed)
 Last filled 09/18/22025  Last OV 09/10/2024  Upcoming appointment 03/10/2025

## 2024-12-09 ENCOUNTER — Encounter: Payer: Self-pay | Admitting: Family Medicine

## 2024-12-09 ENCOUNTER — Ambulatory Visit (INDEPENDENT_AMBULATORY_CARE_PROVIDER_SITE_OTHER): Admitting: Family Medicine

## 2024-12-09 ENCOUNTER — Ambulatory Visit: Payer: Self-pay

## 2024-12-09 VITALS — BP 157/73 | HR 69 | Ht 67.0 in | Wt 267.0 lb

## 2024-12-09 DIAGNOSIS — I87009 Postthrombotic syndrome without complications of unspecified extremity: Secondary | ICD-10-CM | POA: Diagnosis not present

## 2024-12-09 DIAGNOSIS — I825Y1 Chronic embolism and thrombosis of unspecified deep veins of right proximal lower extremity: Secondary | ICD-10-CM | POA: Diagnosis not present

## 2024-12-09 DIAGNOSIS — M79661 Pain in right lower leg: Secondary | ICD-10-CM | POA: Insufficient documentation

## 2024-12-09 DIAGNOSIS — I872 Venous insufficiency (chronic) (peripheral): Secondary | ICD-10-CM

## 2024-12-09 DIAGNOSIS — M7989 Other specified soft tissue disorders: Secondary | ICD-10-CM

## 2024-12-09 NOTE — Progress Notes (Signed)
 Natalie Carney - 68 y.o. female MRN [id number]  Date of birth: 1956-06-12  Subjective Chief Complaint  Patient presents with   Blood Clots    HPI Natalie Carney is a 69 y.o. female here today with complaint of leg swelling.  Really is just requesting a referral to vascular surgery.  She has history of chronic DVT with possible new DVT on recent US .   She is currently on lovenox  managed by hematology.  She recently completed a course of doxycycline  for cellulitis.  She does have some warmth over the leg but there is no redness.  Denies fever or chills.   ROS:  A comprehensive ROS was completed and negative except as noted per HPI  Allergies[1]  Past Medical History:  Diagnosis Date   Abnormal mammogram    Arthritis    both knees   Colon polyp    Complication of anesthesia    DVT (deep venous thrombosis) (HCC)    several dvt's last dvt was 3 to 4 yrs ago per pt on 06-14-2022   GERD (gastroesophageal reflux disease)    Hypertension    Lymphedema    both legs uses compression machine at home and wears compression hose   PONV (postoperative nausea and vomiting)    post op ponv after myometcomy yrs ago   Posterior vaginal wall prolapse    Pulmonary embolism (HCC)    yrs ago   Type 2 Diabetes mellitus without complication (HCC)     Past Surgical History:  Procedure Laterality Date   ABDOMINAL HYSTERECTOMY  2005   KNEE SURGERY Right    arthroscopic right knee   LAPAROSCOPIC GASTRIC BANDING     MYOMECTOMY  2003   RECTOCELE REPAIR N/A 06/18/2022   Procedure: POSTERIOR REPAIR (RECTOCELE), ILLIOCOCCYGEAL SUSPENSION;  Surgeon: Marilynne Rosaline SAILOR, MD;  Location: [facility];  Service: Gynecology;  Laterality: N/A;   SHOULDER SURGERY Left    x 2 yrs ago    Social History   Socioeconomic History   Marital status: Married    Spouse name: Not on file   Number of children: Not on file   Years of education: Not on file   Highest education level:  Master's degree (e.g., MA, MS, MEng, MEd, MSW, MBA)  Occupational History   Not on file  Tobacco Use   Smoking status: Former    Current packs/day: 0.00    Types: Cigarettes    Quit date: 12/25/1987    Years since quitting: 36.9   Smokeless tobacco: Never   Tobacco comments:    Social smoker quit 1989  Vaping Use   Vaping status: Never Used  Substance and Sexual Activity   Alcohol use: Never   Drug use: Never   Sexual activity: Yes    Partners: Male    Birth control/protection: Surgical  Other Topics Concern   Not on file  Social History Narrative   Not on file   Social Drivers of Health   Tobacco Use: Medium Risk (11/23/2024)   Patient History    Smoking Tobacco Use: Former    Smokeless Tobacco Use: Never    Passive Exposure: Not on Actuary Strain: Low Risk (03/10/2024)   Overall Financial Resource Strain (CARDIA)    Difficulty of Paying Living Expenses: Not hard at all  Food Insecurity: No Food Insecurity (03/10/2024)   Hunger Vital Sign    Worried About Running Out of Food in the Last Year: Never true    Ran Out  of Food in the Last Year: Never true  Transportation Needs: No Transportation Needs (03/10/2024)   PRAPARE - Administrator, Civil Service (Medical): No    Lack of Transportation (Non-Medical): No  Physical Activity: Sufficiently Active (03/10/2024)   Exercise Vital Sign    Days of Exercise per Week: 4 days    Minutes of Exercise per Session: 60 min  Stress: No Stress Concern Present (03/10/2024)   [employer] of Occupational Health - Occupational Stress Questionnaire    Feeling of Stress : Only a little  Social Connections: Socially Integrated (03/10/2024)   Social Connection and Isolation Panel    Frequency of Communication with Friends and Family: More than three times a week    Frequency of Social Gatherings with Friends and Family: Three times a week    Attends Religious Services: More than 4 times per year    Active  Member of Clubs or Organizations: Yes    Attends Banker Meetings: More than 4 times per year    Marital Status: Married  Depression (PHQ2-9): Low Risk (11/23/2024)   Depression (PHQ2-9)    PHQ-2 Score: 0  Alcohol Screen: Not on file  Housing: Low Risk (03/10/2024)   Housing Stability Vital Sign    Unable to Pay for Housing in the Last Year: No    Number of Times Moved in the Last Year: 0    Homeless in the Last Year: No  Utilities: Not on file  Health Literacy: Not on file    Family History  Problem Relation Age of Onset   Pneumonia Mother    Diabetes Father    Hypertension Father    Heart attack Father    Cervical cancer Maternal Grandmother    Breast cancer Maternal Aunt    Breast cancer Other        cousin   Ovarian cancer Other        m. grandmother    Health Maintenance  Topic Date Due   Medicare Annual Wellness (AWV)  Never done   Hepatitis C Screening  Never done   Colonoscopy  Never done   Zoster Vaccines- Shingrix (2 of 2) 08/16/2023   HEMOGLOBIN A1C  03/10/2025   OPHTHALMOLOGY EXAM  08/19/2025   Diabetic kidney evaluation - Urine ACR  09/10/2025   FOOT EXAM  09/10/2025   Diabetic kidney evaluation - eGFR measurement  11/17/2025   Mammogram  08/19/2026   DTaP/Tdap/Td (2 - Td or Tdap) 03/11/2034   Pneumococcal Vaccine: 50+ Years  Completed   Influenza Vaccine  Completed   Bone Density Scan  Completed   Meningococcal B Vaccine  Aged Out   COVID-19 Vaccine  Discontinued     ----------------------------------------------------------------------------------------------------------------------------------------------------------------------------------------------------------------- Physical Exam BP (!) 157/73   Pulse 69   Ht 5' 7 (1.702 m)   Wt 267 lb (121.1 kg)   SpO2 100%   BMI 41.82 kg/m   Physical Exam Constitutional:      Appearance: Normal appearance.  HENT:     Head: Normocephalic and atraumatic.  Cardiovascular:     Rate and  Rhythm: Normal rate and regular rhythm.  Pulmonary:     Effort: Pulmonary effort is normal.     Breath sounds: Normal breath sounds.  Musculoskeletal:     Comments: Swelling or RLE.  Mild TTP and warmth over varicosities on posterior calf.  No erythema.    Neurological:     Mental Status: She is alert.  Psychiatric:  Mood and Affect: Mood normal.        Behavior: Behavior normal.     ------------------------------------------------------------------------------------------------------------------------------------------------------------------------------------------------------------------- Assessment and Plan  Pain and swelling of lower leg, right Continue anticoagulation with lovenox .  No signs of cellulitis at this time.  Red flags reviewed.  Referral placed to vascular surgery.    No orders of the defined types were placed in this encounter.   No follow-ups on file.        [1]  Allergies Allergen Reactions   Nsaids Other (See Comments)    On Blood Thinners

## 2024-12-09 NOTE — Assessment & Plan Note (Signed)
 Continue anticoagulation with lovenox .  No signs of cellulitis at this time.  Red flags reviewed.  Referral placed to vascular surgery.

## 2024-12-09 NOTE — Telephone Encounter (Signed)
 FYI Only or Action Required?: FYI only for provider: appointment scheduled on 12/09/2024 at 1:50pm with Dr Velma Ku.  Patient was last seen in primary care on 09/10/2024 by Natalie Mini, NP.  Called Nurse Triage reporting Leg Swelling.  Symptoms began about a month ago.  Interventions attempted: Prescription medications: doxycycline  for recent cellulitis and Rest, hydration, or home remedies.  Symptoms are: unchanged.  Triage Disposition: See HCP Within 4 Hours (Or PCP Triage)  Patient/caregiver understands and will follow disposition?:  Yes                  Copied from CRM #[id number]. Topic: Clinical - Red Word Triage >> Dec 09, 2024 11:03 AM Wess RAMAN wrote: Red Word that prompted transfer to Nurse Triage: Swelling in right leg, ankle, and foot, pain level 8 if walking. 5 if resting  Would like referral for vein and vascular. Need an appt, has not discussed with PCP Reason for Disposition  [1] Thigh or calf pain AND [2] only 1 side AND [3] present > 1 hour  Answer Assessment - Initial Assessment Questions Right lower leg swelling up to the knee that has been going on 30 days ago with pain Patient went to the hospital twice in the past month Patient states Natalie Carney put her back on Lovenox  Patient states the hospital didn't know about if the clot was old or new Patient also had cellulitis in this leg---was on an antibiotic for ten days--filled on the 25th of November--doxycycline  100mg  Patient was looking to get a possible referral to a vascular or vein specialist  Patient has a history of blood clots in her leg---states she had this same leg assessed in the ER a month ago, has taken an antibiotic doxycycline  that was prescribed on the 25th of November for ten days but states that helped somewhat but there is still noted swelling Patient denies chest pain or difficulty breathing   Patient is advised to call us  back if anything changes or with any further  questions/concerns. Patient is advised that if anything worsens to go to the Emergency Room. Patient verbalized understanding.  Protocols used: Leg Swelling and Edema-A-AH

## 2024-12-10 ENCOUNTER — Telehealth: Payer: Self-pay

## 2024-12-10 NOTE — Telephone Encounter (Signed)
 Hi Carol,  Are you aware of what form they are speaking of ? I did check media tab and did not see one there.

## 2024-12-10 NOTE — Telephone Encounter (Signed)
 Copied from CRM #[id number]. Topic: Clinical - Medical Advice >> Dec 10, 2024  1:10 PM Anairis L wrote: Reason for CRM: Sheree from [employer] called to see if we received form.  She needs the form signed and faxed back.

## 2024-12-11 ENCOUNTER — Telehealth: Payer: Self-pay

## 2024-12-11 NOTE — Telephone Encounter (Signed)
 Copied from CRM #[id number]. Topic: General - Other >> Dec 08, 2024  2:31 PM Dedra B wrote: Reason for CRM: Sheree from [employer] called to se if form regarding statin therapy had been received. She will resend form. She needs the form signed and faxed back. >> Dec 09, 2024  1:34 PM Gustabo D wrote: Sherre Statin Therapy recommendation for was received  Call back [phone]  Reference # [id number]

## 2024-12-14 ENCOUNTER — Other Ambulatory Visit: Payer: Self-pay

## 2024-12-14 MED ORDER — OMEPRAZOLE 40 MG PO CPDR: 40.0000 mg | DELAYED_RELEASE_CAPSULE | Freq: Every day | ORAL | 0 refills | Status: AC

## 2024-12-14 NOTE — Telephone Encounter (Signed)
 Last filled 08/29/2024 by Historical Provider  Last OV 09/10/2024  Upcoming visit 02/23/2025

## 2024-12-14 NOTE — Addendum Note (Signed)
 Addended by: FANNY NIELS CROME on: 12/14/2024 04:06 PM   Modules accepted: Orders

## 2025-01-26 ENCOUNTER — Telehealth: Payer: Self-pay

## 2025-01-26 ENCOUNTER — Telehealth: Payer: Self-pay | Admitting: *Deleted

## 2025-01-26 NOTE — Telephone Encounter (Signed)
Left patient a message to call and schedule. 

## 2025-01-26 NOTE — Telephone Encounter (Signed)
 Pt called into office and left voicemail to schedule appointment for breast exam with noting left breast swollen. Message sent to front office to schedule with provider.   Silvano LELON Piano, RN

## 2025-01-27 NOTE — Progress Notes (Unsigned)
" ° °  GYNECOLOGY OFFICE VISIT NOTE  History:  Natalie Carney is a 69 y.o. G1P1 here today for swollen  breast.   ***.   The following portions of the patient's history were reviewed and updated as appropriate: allergies, current medications, past family history, past medical history, past social history, past surgical history and problem list.   Health Maintenance:   Normal mammogram on 08/25/24.   Review of Systems:  Pertinent items noted in HPI and remainder of comprehensive ROS otherwise negative.  Physical Exam:  There were no vitals taken for this visit. CONSTITUTIONAL: Well-developed, well-nourished female in no acute distress.  HEENT:  Normocephalic, atraumatic. External right and left ear normal. No scleral icterus.  NECK: Normal range of motion, supple, no masses noted on observation SKIN: No rash noted. Not diaphoretic. No erythema. No pallor. MUSCULOSKELETAL: Normal range of motion. No edema noted. NEUROLOGIC: Alert and oriented to person, place, and time. Normal muscle tone coordination. No cranial nerve deficit noted. PSYCHIATRIC: Normal mood and affect. Normal behavior. Normal judgment and thought content. Breasts: {pe breast exam:[id number]}.   Labs and Imaging No results found for this or any previous visit (from the past week). No results found.  Assessment and Plan:  1. Breast swelling (Primary) ***    Diagnoses and all orders for this visit:  Breast swelling     No orders of the defined types were placed in this encounter.    Routine preventative health maintenance measures emphasized. Please refer to After Visit Summary for other counseling recommendations.   No follow-ups on file.  Vina Solian, MD, FACOG Obstetrician & Gynecologist, [facility], [facility]      "

## 2025-01-28 ENCOUNTER — Other Ambulatory Visit: Payer: Self-pay | Admitting: Obstetrics and Gynecology

## 2025-01-28 ENCOUNTER — Encounter: Payer: Self-pay | Admitting: Obstetrics and Gynecology

## 2025-01-28 ENCOUNTER — Other Ambulatory Visit: Payer: Self-pay | Admitting: Medical Oncology

## 2025-01-28 ENCOUNTER — Inpatient Hospital Stay

## 2025-01-28 ENCOUNTER — Other Ambulatory Visit: Payer: Self-pay

## 2025-01-28 ENCOUNTER — Ambulatory Visit: Admitting: Medical Oncology

## 2025-01-28 ENCOUNTER — Ambulatory Visit: Admitting: Obstetrics and Gynecology

## 2025-01-28 VITALS — BP 130/81 | HR 78 | Ht 67.0 in | Wt 267.0 lb

## 2025-01-28 DIAGNOSIS — N63 Unspecified lump in unspecified breast: Secondary | ICD-10-CM

## 2025-01-28 DIAGNOSIS — Z7901 Long term (current) use of anticoagulants: Secondary | ICD-10-CM

## 2025-01-28 DIAGNOSIS — N61 Mastitis without abscess: Secondary | ICD-10-CM

## 2025-01-28 DIAGNOSIS — I824Y9 Acute embolism and thrombosis of unspecified deep veins of unspecified proximal lower extremity: Secondary | ICD-10-CM

## 2025-01-28 DIAGNOSIS — R234 Changes in skin texture: Secondary | ICD-10-CM

## 2025-01-28 DIAGNOSIS — I2699 Other pulmonary embolism without acute cor pulmonale: Secondary | ICD-10-CM

## 2025-01-28 MED ORDER — ENOXAPARIN SODIUM 120 MG/0.8ML IJ SOSY: 120.0000 mg | PREFILLED_SYRINGE | Freq: Two times a day (BID) | INTRAMUSCULAR | 12 refills | Status: AC

## 2025-01-28 MED ORDER — SULFAMETHOXAZOLE-TRIMETHOPRIM 800-160 MG PO TABS: 1.0000 | ORAL_TABLET | Freq: Two times a day (BID) | ORAL | 0 refills | Status: AC

## 2025-01-29 ENCOUNTER — Telehealth: Admitting: Medical-Surgical

## 2025-01-29 ENCOUNTER — Encounter: Payer: Self-pay | Admitting: Medical-Surgical

## 2025-01-29 DIAGNOSIS — Z7901 Long term (current) use of anticoagulants: Secondary | ICD-10-CM

## 2025-01-29 MED ORDER — SERTRALINE HCL 50 MG PO TABS: ORAL_TABLET | ORAL | 3 refills | Status: AC

## 2025-01-29 MED ORDER — BUSPIRONE HCL 5 MG PO TABS: 5.0000 mg | ORAL_TABLET | Freq: Two times a day (BID) | ORAL | 1 refills | Status: AC

## 2025-01-29 NOTE — Progress Notes (Signed)
 Virtual Visit via Video Note  I connected with Natalie Carney on 01/29/25 at 10:10 AM EST by a video enabled telemedicine application and verified that I am speaking with the correct person using two identifiers.   I discussed the limitations of evaluation and management by telemedicine and the availability of in person appointments. The patient expressed understanding and agreed to proceed.  Patient location: home Provider locations: office  Subjective:    Discussed the use of AI scribe software for clinical note transcription with the patient, who gave verbal consent to proceed.  History of Present Illness   Natalie Carney is a 69 year old female who presents with anxiety related to Lovenox  injections.  Anxiety related to anticoagulation therapy - Significant anxiety regarding self-administration of Lovenox  injections - Anxiety occupies thoughts throughout the day and has resulted in missed doses - No prior history of anxiety before starting Lovenox  - No previous use of medication for anxiety - Anxiety is primarily related to the mental aspect rather than due to physical pain from injections  Difficulties with lovenox  administration - Extensive bruising and palpable knots at injection sites - Difficulty finding new areas for injections due to site changes - Uses ice after injections and applies skin cream for dryness at injection sites  Anticoagulation therapy history - Failed multiple anticoagulants, including Coumadin, Xarelto, Eliquis , and Pradaxa  - Previously used Lovenox  for approximately one year but discontinued due to difficulty managing injections      Past medical history, Surgical history, Family history not pertinant except as noted below, Social history, Allergies, and medications have been entered into the medical record, reviewed, and corrections made.   Review of Systems: See HPI for pertinent positives and negatives.   Objective:    General:  Speaking clearly in complete sentences without any shortness of breath.  Alert and oriented x3.  Normal judgment. No apparent acute distress.  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 No prior anxiety history. Sertraline  selected for long-term management; Buspar  for acute episodes. - Counseled on possible side effects including transient headache and nausea with sertraline . - Prescribed sertraline  25 mg, half tablet daily for 7 days, then full tablet if tolerated. - Prescribed Buspar  5 mg, one tablet twice daily as needed for acute anxiety. - Discussed Emla cream for numbing, prefers ice post-injection. - Advised gentle massage with cocoa butter or vitamin E oil for bruising and knots. - Follow up on March 3rd to review medications.    I discussed the assessment and treatment plan with the patient. The patient was provided an opportunity to ask questions and all were answered. The patient agreed with the plan and demonstrated an understanding of the instructions.   The patient was advised to call back or seek an in-person evaluation if the symptoms worsen or if the condition fails to improve as anticipated.  Return if symptoms worsen or fail to improve.  Zada FREDRIK Palin, DNP, APRN, FNP-BC [facility]

## 2025-02-02 ENCOUNTER — Encounter

## 2025-02-02 ENCOUNTER — Other Ambulatory Visit

## 2025-02-23 ENCOUNTER — Ambulatory Visit: Admitting: Medical-Surgical

## 2025-02-24 ENCOUNTER — Ambulatory Visit: Payer: Self-pay | Admitting: Obstetrics and Gynecology

## 2025-03-10 ENCOUNTER — Ambulatory Visit: Admitting: Medical-Surgical

## 2025-03-10 ENCOUNTER — Ambulatory Visit (HOSPITAL_COMMUNITY)

## 2025-03-10 ENCOUNTER — Encounter

## 2025-03-15 ENCOUNTER — Ambulatory Visit: Admitting: Obstetrics and Gynecology

## 2025-03-31 ENCOUNTER — Ambulatory Visit: Admitting: Obstetrics and Gynecology

## 2025-05-24 ENCOUNTER — Inpatient Hospital Stay

## 2025-05-24 ENCOUNTER — Inpatient Hospital Stay: Admitting: Medical Oncology
